# Patient Record
Sex: Female | Born: 1985 | Race: Black or African American | Hispanic: No | Marital: Single | Smoking: Never smoker
Health system: Southern US, Community
[De-identification: ages and names within clinical notes are randomized; demographics above are authoritative.]

## PROBLEM LIST (undated history)

## (undated) ENCOUNTER — Inpatient Hospital Stay (HOSPITAL_COMMUNITY): Payer: Self-pay

## (undated) DIAGNOSIS — G43909 Migraine, unspecified, not intractable, without status migrainosus: Secondary | ICD-10-CM

## (undated) DIAGNOSIS — O039 Complete or unspecified spontaneous abortion without complication: Secondary | ICD-10-CM

## (undated) DIAGNOSIS — A6000 Herpesviral infection of urogenital system, unspecified: Secondary | ICD-10-CM

## (undated) DIAGNOSIS — Z98891 History of uterine scar from previous surgery: Secondary | ICD-10-CM

## (undated) DIAGNOSIS — K219 Gastro-esophageal reflux disease without esophagitis: Secondary | ICD-10-CM

## (undated) DIAGNOSIS — D649 Anemia, unspecified: Secondary | ICD-10-CM

## (undated) DIAGNOSIS — IMO0002 Reserved for concepts with insufficient information to code with codable children: Secondary | ICD-10-CM

## (undated) DIAGNOSIS — I10 Essential (primary) hypertension: Secondary | ICD-10-CM

## (undated) HISTORY — DX: Complete or unspecified spontaneous abortion without complication: O03.9

## (undated) HISTORY — DX: Essential (primary) hypertension: I10

## (undated) HISTORY — DX: Migraine, unspecified, not intractable, without status migrainosus: G43.909

---

## 2011-06-14 DIAGNOSIS — IMO0002 Reserved for concepts with insufficient information to code with codable children: Secondary | ICD-10-CM

## 2011-06-14 HISTORY — PX: COLPOSCOPY: SHX161

## 2011-06-14 HISTORY — DX: Reserved for concepts with insufficient information to code with codable children: IMO0002

## 2011-10-06 ENCOUNTER — Encounter (HOSPITAL_COMMUNITY): Payer: Self-pay | Admitting: *Deleted

## 2011-10-06 ENCOUNTER — Emergency Department (HOSPITAL_COMMUNITY)
Admission: EM | Admit: 2011-10-06 | Discharge: 2011-10-06 | Disposition: A | Discharge status: Home / Self Care | Payer: 59 | Source: Home / Self Care | Attending: Family Medicine | Admitting: Family Medicine

## 2011-10-06 DIAGNOSIS — R350 Frequency of micturition: Secondary | ICD-10-CM

## 2011-10-06 HISTORY — DX: History of uterine scar from previous surgery: Z98.891

## 2011-10-06 LAB — POCT URINALYSIS DIP (DEVICE)
Ketones, ur: NEGATIVE mg/dL
Leukocytes, UA: NEGATIVE
Protein, ur: NEGATIVE mg/dL
pH: 7.5 (ref 5.0–8.0)

## 2011-10-06 MED ORDER — CEPHALEXIN 500 MG PO CAPS: 500.0000 mg | ORAL_CAPSULE | Freq: Two times a day (BID) | ORAL | Status: AC

## 2011-10-06 NOTE — Discharge Instructions (Signed)
Avoid caffeine products. Follow up with your pcp or return if symptoms persist or worsen.

## 2011-10-06 NOTE — ED Notes (Signed)
Pt  Reports  symptpms  Of  Frequent  Urination   Low  abd  Pain     X  sev  Weeks  She  Reports      Symptoms  Similar  To  uti  She  Had  In past

## 2011-10-06 NOTE — ED Provider Notes (Signed)
History     CSN: [id number]  Arrival date & time 10/06/11  1134   First MD Initiated Contact with Patient 10/06/11 1205      Chief Complaint  Patient presents with  . Urinary Tract Infection    (Consider location/radiation/quality/duration/timing/severity/associated sxs/prior treatment) HPI Comments: The patient reports she has symptoms of a uti. Onset 2 wks ago. Has frequency,urgency and voiding small amts. Some pelvic pressure. No flank pain. No n/v/ no fever  The history is provided by the patient.    Past Medical History  Diagnosis Date  . Previous cesarean section     History reviewed. No pertinent past surgical history.  Family History  Problem Relation Age of Onset  . Hypertension Mother     History  Substance Use Topics  . Smoking status: Never Smoker   . Smokeless tobacco: Not on file  . Alcohol Use: Yes    OB History    Grav Para Term Preterm Abortions TAB SAB Ect Mult Living                  Review of Systems  Constitutional: Negative.   HENT: Negative.   Respiratory: Negative.   Cardiovascular: Negative.   Gastrointestinal: Negative.   Genitourinary: Positive for dysuria, urgency and frequency. Negative for hematuria, flank pain, vaginal bleeding, vaginal discharge and vaginal pain.  Musculoskeletal: Negative.     Allergies  Review of patient's allergies indicates no known allergies.  Home Medications   Current Outpatient Rx  Name Route Sig Dispense Refill  . CEPHALEXIN 500 MG PO CAPS Oral Take 1 capsule (500 mg total) by mouth 2 (two) times daily. 14 capsule 0    BP 123/72  Pulse 84  Temp(Src) 97.7 F (36.5 C) (Oral)  Resp 20  SpO2 97%  LMP 09/05/2011  Physical Exam  Nursing note and vitals reviewed. Constitutional: She appears well-developed and well-nourished. No distress.  HENT:  Head: Normocephalic and atraumatic.  Cardiovascular: Normal rate.   Pulmonary/Chest: Effort normal.  Abdominal: Soft. Bowel sounds are normal.  There is no tenderness. There is no rebound and no guarding.  Musculoskeletal: Normal range of motion.  Skin: Skin is warm and dry.    ED Course  Procedures (including critical care time)   Labs Reviewed  POCT PREGNANCY, URINE  POCT URINALYSIS DIP (DEVICE)   No results found.   1. Urinary frequency       MDM          Randa Spike, MD 10/06/11 1229

## 2012-03-13 DIAGNOSIS — A6000 Herpesviral infection of urogenital system, unspecified: Secondary | ICD-10-CM

## 2012-03-13 HISTORY — DX: Herpesviral infection of urogenital system, unspecified: A60.00

## 2012-03-16 ENCOUNTER — Encounter (HOSPITAL_COMMUNITY): Payer: Self-pay | Admitting: Emergency Medicine

## 2012-03-16 ENCOUNTER — Emergency Department (HOSPITAL_COMMUNITY)
Admission: EM | Admit: 2012-03-16 | Discharge: 2012-03-16 | Disposition: A | Discharge status: Home / Self Care | Payer: 59 | Source: Home / Self Care

## 2012-03-16 DIAGNOSIS — R0982 Postnasal drip: Secondary | ICD-10-CM

## 2012-03-16 DIAGNOSIS — J029 Acute pharyngitis, unspecified: Secondary | ICD-10-CM

## 2012-03-16 DIAGNOSIS — J329 Chronic sinusitis, unspecified: Secondary | ICD-10-CM

## 2012-03-16 MED ORDER — MAGIC MOUTHWASH: ORAL | Status: DC

## 2012-03-16 NOTE — ED Provider Notes (Signed)
History     CSN: [id number]  Arrival date & time 03/16/12  1559   None     Chief Complaint  Patient presents with  . Sore Throat    (Consider location/radiation/quality/duration/timing/severity/associated sxs/prior treatment) HPI Comments: 26 year old female with sore throat for one week. She been clearing her throat a lot and complaining of PND. She also has had some sinus issues for which she has taken Sudafed. Associated symptoms include cough that is worse in the morning upon awakening. At this time she also has to blow her nose which sometimes contains blood-tinged mucus. Denies earache, fever,chills or GI symptoms.  Patient is a 26 y.o. female presenting with pharyngitis. The history is provided by the patient.  Sore Throat The current episode started more than 2 days ago. The problem occurs constantly. The problem has not changed since onset.Pertinent negatives include no chest pain, no abdominal pain, no headaches and no shortness of breath.    Past Medical History  Diagnosis Date  . Previous cesarean section     Past Surgical History  Procedure Date  . Cesarean section     Family History  Problem Relation Age of Onset  . Hypertension Mother     History  Substance Use Topics  . Smoking status: Never Smoker   . Smokeless tobacco: Not on file  . Alcohol Use: Yes    OB History    Grav Para Term Preterm Abortions TAB SAB Ect Mult Living                  Review of Systems  Constitutional: Negative for fever, chills, activity change, appetite change and fatigue.  HENT: Positive for congestion, sore throat, sneezing and postnasal drip. Negative for facial swelling, rhinorrhea, trouble swallowing, neck pain, neck stiffness and voice change.   Eyes: Negative.   Respiratory: Negative.  Negative for shortness of breath.   Cardiovascular: Negative.  Negative for chest pain.  Gastrointestinal: Negative for nausea, vomiting and abdominal pain.  Genitourinary:  Negative.   Skin: Negative for pallor and rash.  Neurological: Negative.  Negative for headaches.    Allergies  Review of patient's allergies indicates no known allergies.  Home Medications   Current Outpatient Rx  Name Route Sig Dispense Refill  . DEXTROMETHORPHAN POLISTIREX ER 30 MG/5ML PO LQCR Oral Take 60 mg by mouth as needed.    Marland Kitchen OVER THE COUNTER MEDICATION  Cough drops, pseudofed    . MAGIC MOUTHWASH  Rinse and spit 10 ml q 3 hours 150 mL 0    Use the Diphenhydramine,alumand mag -OH-simeth,Nys ...    BP 119/88  Pulse 72  Temp 98.3 F (36.8 C) (Oral)  Resp 18  SpO2 100%  Physical Exam  Constitutional: She is oriented to person, place, and time. She appears well-developed and well-nourished. No distress.  HENT:  Right Ear: External ear normal.  Left Ear: External ear normal.       OP with patchy erythema. Soft palate is red but not edematous. Soft palate and uvula with spotty red spots some of which appear to be petechiae. No exudates on the tonsillar pillars or the OP however the roof of the mouth has 2 elongated quite patches could possibly be an exudate. The tongue has a thick brownish titrating with some clearing in smoothing of the surface of the posterior aspect  Neck: Normal range of motion. Neck supple.  Cardiovascular: Normal rate and regular rhythm.   Pulmonary/Chest: Effort normal and breath sounds normal. No respiratory distress.  She has no wheezes.  Abdominal: Soft. There is no tenderness.  Musculoskeletal: Normal range of motion. She exhibits no edema.  Lymphadenopathy:    She has no cervical adenopathy.  Neurological: She is alert and oriented to person, place, and time.  Skin: Skin is warm and dry. No rash noted.  Psychiatric: She has a normal mood and affect.    ED Course  Procedures (including critical care time)   Labs Reviewed  POCT RAPID STREP A ([facility] ONLY)   No results found.   1. Pharyngitis   2. Post-nasal discharge        MDM   Results for orders placed during the hospital encounter of 03/16/12  POCT RAPID STREP A ([facility] ONLY)      Component Value Range   Streptococcus, Group A Screen (Direct) NEGATIVE  NEGATIVE   They use Claritin or Allegra daily when necessary PND. Tylenol or Motrin for pain in the throat. Drink plenty of fluids stay well hydrated. Magic mouthwash rinses with 2 teaspoons every 2-3 hours and spit.         Hayden Rasmussen, NP 03/16/12 2047

## 2012-03-16 NOTE — ED Notes (Signed)
Cough, congested , blood tinged phlegm, sore throat, reports "something" on throat.  Onset of symptom about 5-6 days ago

## 2012-03-16 NOTE — ED Provider Notes (Signed)
Medical screening examination/treatment/procedure(s) were performed by non-physician practitioner and as supervising physician I was immediately available for consultation/collaboration.  Leslee Home, M.D.   Reuben Likes, MD 03/16/12 2056

## 2012-03-26 ENCOUNTER — Telehealth: Payer: Self-pay | Admitting: Obstetrics and Gynecology

## 2012-03-29 ENCOUNTER — Telehealth: Payer: Self-pay | Admitting: Obstetrics and Gynecology

## 2012-03-30 ENCOUNTER — Encounter: Payer: Self-pay | Admitting: Obstetrics and Gynecology

## 2012-03-30 ENCOUNTER — Ambulatory Visit (INDEPENDENT_AMBULATORY_CARE_PROVIDER_SITE_OTHER): Payer: 59 | Admitting: Obstetrics and Gynecology

## 2012-03-30 VITALS — BP 120/82 | Temp 99.4°F | Wt 170.0 lb

## 2012-03-30 DIAGNOSIS — D280 Benign neoplasm of vulva: Secondary | ICD-10-CM

## 2012-03-30 DIAGNOSIS — N898 Other specified noninflammatory disorders of vagina: Secondary | ICD-10-CM

## 2012-03-30 DIAGNOSIS — N390 Urinary tract infection, site not specified: Secondary | ICD-10-CM

## 2012-03-30 DIAGNOSIS — R350 Frequency of micturition: Secondary | ICD-10-CM

## 2012-03-30 LAB — POCT URINALYSIS DIPSTICK
Bilirubin, UA: NEGATIVE
Blood, UA: NEGATIVE
Ketones, UA: NEGATIVE
Nitrite, UA: NEGATIVE
Spec Grav, UA: 1.01
pH, UA: 6

## 2012-03-30 LAB — POCT WET PREP (WET MOUNT)
Clue Cells Wet Prep Whiff POC: NEGATIVE
pH: 4.5

## 2012-03-30 MED ORDER — VALACYCLOVIR HCL 1 G PO TABS: 2000.0000 mg | ORAL_TABLET | Freq: Two times a day (BID) | ORAL | Status: AC

## 2012-03-30 NOTE — Progress Notes (Signed)
Pt presents with c/o of two sores on the left labia.  They are painful.  She has never had this before.  No h/o HSV in the past.  She also c/o pelvic pressure with urinating and a white milky discharge BP 120/82  Temp 99.4 F (37.4 C)  Wt 170 lb (77.111 kg)  LMP 03/16/2012 Physical Examination: General appearance - alert, well appearing, and in no distress Chest - clear to auscultation, no wheezes, rales or rhonchi, symmetric air entry Heart - normal rate and regular rhythm Abdomen - soft, nontender, nondistended, no masses or organomegaly Pelvic - VULVA: on the pts left two flat tendr vessicles.  , VAGINA: normal appearing vagina with normal color and discharge, no lesions, CERVIX: normal appearing cervix without discharge or lesions, UTERUS: uterus is normal size, shape, consistency and nontender, ADNEXA: normal adnexa in size, nontender and no masses, WET MOUNT done - results: negative for pathogens, normal epithelial cells Results for orders placed in visit on 03/30/12  POCT WET PREP (WET MOUNT)      Component Value Range   Source Wet Prep POC       WBC, Wet Prep HPF POC       Bacteria Wet Prep HPF POC       BACTERIA WET PREP MORPHOLOGY POC       Clue Cells Wet Prep HPF POC None     CLUE CELLS WET PREP WHIFF POC Negative Whiff     Yeast Wet Prep HPF POC None     KOH Wet Prep POC       Trichomonas Wet Prep HPF POC NONE     pH 4.5    POCT URINALYSIS DIPSTICK      Component Value Range   Color, UA       Clarity, UA       Glucose, UA NEG     Bilirubin, UA NEG     Ketones, UA NEG     Spec Grav, UA 1.010     Blood, UA NEG     pH, UA 6.0     Protein, UA NEG     Urobilinogen, UA negative     Nitrite, UA NEG     Leukocytes, UA small (1+)    vulvar lesions c/w HSV.  Culture sent.  Valtrex rx given to pt Discussed the pathophysiology of HSV and ACOG brochure given to pt R 2 weeks for AEX.  Pt desires std testing at that time

## 2012-04-01 LAB — URINE CULTURE: Organism ID, Bacteria: NO GROWTH

## 2012-04-04 ENCOUNTER — Telehealth: Payer: Self-pay

## 2012-04-04 NOTE — Telephone Encounter (Signed)
Lm on vm tcb rgd labs 

## 2012-04-04 NOTE — Telephone Encounter (Signed)
Message copied by Rolla Plate on Wed Apr 04, 2012  8:55 AM ------      Message from: Jaymes Graff      Created: Tue Apr 03, 2012 10:00 PM       Please give the pt her results.        ----- Message -----         From: Lerry Liner, CMA         Sent: 03/30/2012   2:23 PM           To: Michael Litter, MD

## 2012-04-04 NOTE — Telephone Encounter (Signed)
Spoke with pt rgd labs informed labs wnl pt voice understanding 

## 2012-04-05 ENCOUNTER — Telehealth: Payer: Self-pay | Admitting: Obstetrics and Gynecology

## 2012-04-05 NOTE — Telephone Encounter (Signed)
TC TO PT REGARDING MESSAGE. PT STATES THAT SHE THINK SHE WILL BE ON CYCLE AT THE TIME SHE SCHEDULED APPT AND I TOLD PT AS LONG AS IT IS NOT REAL HVY SHE WOULD BE OKAY AND PT STATES THAT SHE WILL KEEP APPT.

## 2012-04-11 ENCOUNTER — Ambulatory Visit: Payer: 59 | Admitting: Obstetrics and Gynecology

## 2012-10-31 LAB — OB RESULTS CONSOLE ABO/RH: RH Type: POSITIVE

## 2012-10-31 LAB — OB RESULTS CONSOLE RPR: RPR: NONREACTIVE

## 2012-10-31 LAB — OB RESULTS CONSOLE RUBELLA ANTIBODY, IGM: Rubella: IMMUNE

## 2013-05-01 ENCOUNTER — Other Ambulatory Visit: Payer: Self-pay | Admitting: Obstetrics and Gynecology

## 2013-05-06 LAB — OB RESULTS CONSOLE GBS: GBS: NEGATIVE

## 2013-05-29 ENCOUNTER — Encounter (HOSPITAL_COMMUNITY)
Admission: RE | Admit: 2013-05-29 | Discharge: 2013-05-29 | Disposition: A | Discharge status: Home / Self Care | Payer: 59 | Source: Ambulatory Visit | Attending: Obstetrics and Gynecology | Admitting: Obstetrics and Gynecology

## 2013-05-29 ENCOUNTER — Encounter (HOSPITAL_COMMUNITY): Payer: Self-pay | Admitting: Pharmacist

## 2013-05-29 ENCOUNTER — Encounter (HOSPITAL_COMMUNITY): Payer: Self-pay

## 2013-05-29 DIAGNOSIS — Z01812 Encounter for preprocedural laboratory examination: Secondary | ICD-10-CM | POA: Insufficient documentation

## 2013-05-29 HISTORY — DX: Gastro-esophageal reflux disease without esophagitis: K21.9

## 2013-05-29 HISTORY — DX: Anemia, unspecified: D64.9

## 2013-05-29 LAB — ABO/RH: ABO/RH(D): O POS

## 2013-05-29 LAB — CBC
HCT: 29.8 % — ABNORMAL LOW (ref 36.0–46.0)
Hemoglobin: 9.3 g/dL — ABNORMAL LOW (ref 12.0–15.0)
MCH: 20.5 pg — ABNORMAL LOW (ref 26.0–34.0)
MCHC: 31.2 g/dL (ref 30.0–36.0)
MCV: 65.6 fL — ABNORMAL LOW (ref 78.0–100.0)

## 2013-05-29 LAB — TYPE AND SCREEN

## 2013-05-29 NOTE — Patient Instructions (Addendum)
Your procedure is scheduled on: 05/31/2013  Enter through the Main Entrance of [facility] at: 0745am  Pick up the phone at the desk and dial [phone].  Call this number if you have problems the morning of surgery: [phone].  Remember: Do NOT eat food: AFTER MIDNIGHT THURSDAY Do NOT drink clear liquids after: AFTER MIDNIGHT THURSDAY Take these medicines the morning of surgery with a SIP OF WATER: Zantac  Do NOT wear jewelry (body piercing), make-up, or nail polish. Do NOT wear lotions, powders, or perfumes.  You may wear deoderant. Do NOT shave for 48 hours prior to surgery. Do NOT bring valuables to the hospital. Contacts, dentures, or bridgework may not be worn into surgery. Leave suitcase in car.  After surgery it may be brought to your room.  For patients admitted to the hospital, checkout time is 11:00 AM the day of discharge.

## 2013-05-30 MED ORDER — CEFAZOLIN SODIUM-DEXTROSE 2-3 GM-% IV SOLR
2.0000 g | INTRAVENOUS | Status: AC
  Administered 2013-05-31: 2 g via INTRAVENOUS

## 2013-05-30 MED ORDER — SCOPOLAMINE 1 MG/3DAYS TD PT72: 1.0000 | MEDICATED_PATCH | Freq: Once | TRANSDERMAL | Status: DC

## 2013-05-30 NOTE — H&P (Signed)
Rhonda Ramsey is a 27 y.o. female, G3P1011 at 65 2/7 weeks, presenting for scheduled repeat cesarean on 05/31/13. Denies leaking or bleeding, reports +FM.  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   EDC of 06/05/13 was established by LMP.   Anatomy scan:  17 6/7 weeks, with normal findings and an anterior placenta--limited anatomy Additional [place] evaluations:   20 6/7 weeks, to complete anatomy--all WNL 24 6/7 weeks, due to ? Leaking--all WNL 33 1/7 weeks--EFW 2360, 71.7%ile, normal AFI.   Significant prenatal events:  Received TDaP 10/6, received flu vaccine at work at [employer]. Decided repeat C/S during pregnancy.  Started on FE for iron prophylaxis. Last evaluation:  05/23/13, cervix closed.    OB History   Grav Para Term Preterm Abortions TAB SAB Ect Mult Living   3 1 1  1 1    1     2008--Primary LTCS, in [place], [place], due to [place] and NRFHR.  12 hour labor, 7+4, female. 2013--TAB at 10 weeks  Past Medical History  Diagnosis Date  . Previous cesarean section   . GERD (gastroesophageal reflux disease)   . Anemia    Past Surgical History  Procedure Laterality Date  . Cesarean section     Family History: family history includes Hypertension in her mother. Social History:  reports that she has never smoked. She does not have any smokeless tobacco history on file. She reports that she does not drink alcohol or use illicit drugs.  FOB, [employer], has been involved and supportive. 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   Prenatal Transfer Tool  Maternal Diabetes: No Genetic Screening: Normal Quad screen Maternal Ultrasounds/Referrals: Normal Fetal Ultrasounds or other Referrals:  None Maternal Substance Abuse:  No Significant Maternal Medications:  None Significant Maternal Lab  Results: Lab values include: Group B Strep negative    ROS:  +FM, occasional contractions  No Known Allergies     Last menstrual period 08/29/2012.  Chest clear Heart RRR without murmur Abd gravid, NT, FH 39 Pelvic: Deferred Ext: WNL  FHR: 150 per doppler at office UCs:  None per patient  Prenatal labs: ABO, Rh: --/--/O POS, O POS (12/17 1205) Antibody: NEG (12/17 1205) Rubella:   Immune RPR: NON REACTIVE (12/17 1205)  HBsAg: Negative (05/21 0000)  HIV: Non-reactive (05/21 0000)  GBS:  Negative Sickle cell/Hgb electrophoresis:  Neg Pap:  2012 GC:  Negative at [facility] Chlamydia:  Negative at [facility] Genetic screenings:  Quad screen WNL Glucola:  WNL Other:  Hgb 11.4 at [facility], 9.3 at 28 weeks       Assessment/Plan: IUP at 39 2/7 weeks Previous cesarean, desires repeat Hx HSV, on prophylaxis Anemia  Plan: Admit to [facility] per consult with Dr. Estanislado Pandy Routine [facility] orders. R&B of C/S have been reviewed with the patient, including bleeding, infection, anesthesia, and damage to other organs.  Patient seems to understand these risks and wisheds to proceed.  Jamia Hoban, VICKICNM, [place] 05/30/2013, 1:48 AM

## 2013-05-31 ENCOUNTER — Encounter (HOSPITAL_COMMUNITY)
Admission: AD | Disposition: A | Discharge status: Home / Self Care | Payer: Self-pay | Source: Ambulatory Visit | Attending: Obstetrics and Gynecology

## 2013-05-31 ENCOUNTER — Encounter (HOSPITAL_COMMUNITY): Payer: 59 | Admitting: Anesthesiology

## 2013-05-31 ENCOUNTER — Inpatient Hospital Stay (HOSPITAL_COMMUNITY): Admission: AD | Admit: 2013-05-31 | Payer: 59 | Source: Ambulatory Visit | Admitting: Obstetrics and Gynecology

## 2013-05-31 ENCOUNTER — Encounter (HOSPITAL_COMMUNITY): Payer: Self-pay

## 2013-05-31 ENCOUNTER — Inpatient Hospital Stay (HOSPITAL_COMMUNITY)
Admission: AD | Admit: 2013-05-31 | Discharge: 2013-06-03 | DRG: 766 | Disposition: A | Discharge status: Home / Self Care | Payer: 59 | Source: Ambulatory Visit | Attending: Obstetrics and Gynecology | Admitting: Obstetrics and Gynecology

## 2013-05-31 ENCOUNTER — Inpatient Hospital Stay (HOSPITAL_COMMUNITY): Payer: 59 | Admitting: Anesthesiology

## 2013-05-31 DIAGNOSIS — O34219 Maternal care for unspecified type scar from previous cesarean delivery: Principal | ICD-10-CM | POA: Diagnosis present

## 2013-05-31 DIAGNOSIS — K219 Gastro-esophageal reflux disease without esophagitis: Secondary | ICD-10-CM | POA: Diagnosis present

## 2013-05-31 DIAGNOSIS — D649 Anemia, unspecified: Secondary | ICD-10-CM | POA: Diagnosis present

## 2013-05-31 DIAGNOSIS — O9902 Anemia complicating childbirth: Secondary | ICD-10-CM | POA: Diagnosis present

## 2013-05-31 DIAGNOSIS — E669 Obesity, unspecified: Secondary | ICD-10-CM | POA: Diagnosis present

## 2013-05-31 HISTORY — DX: Herpesviral infection of urogenital system, unspecified: A60.00

## 2013-05-31 HISTORY — DX: Reserved for concepts with insufficient information to code with codable children: IMO0002

## 2013-05-31 LAB — CBC
HCT: 29.9 % — ABNORMAL LOW (ref 36.0–46.0)
MCH: 20.1 pg — ABNORMAL LOW (ref 26.0–34.0)
MCHC: 30.8 g/dL (ref 30.0–36.0)
Platelets: 234 10*3/uL (ref 150–400)
RDW: 15.7 % — ABNORMAL HIGH (ref 11.5–15.5)
WBC: 9.2 10*3/uL (ref 4.0–10.5)

## 2013-05-31 LAB — POCT FERN TEST: POCT Fern Test: POSITIVE

## 2013-05-31 SURGERY — Surgical Case
Anesthesia: Spinal

## 2013-05-31 MED ORDER — CEFAZOLIN SODIUM-DEXTROSE 2-3 GM-% IV SOLR
INTRAVENOUS | Status: AC
  Filled 2013-05-31: qty 50

## 2013-05-31 MED ORDER — METOCLOPRAMIDE HCL 5 MG/ML IJ SOLN: 10.0000 mg | Freq: Three times a day (TID) | INTRAMUSCULAR | Status: DC | PRN

## 2013-05-31 MED ORDER — NALOXONE HCL 1 MG/ML IJ SOLN
1.0000 ug/kg/h | INTRAVENOUS | Status: DC | PRN
  Filled 2013-05-31: qty 2

## 2013-05-31 MED ORDER — NALBUPHINE HCL 10 MG/ML IJ SOLN
5.0000 mg | INTRAMUSCULAR | Status: DC | PRN
  Filled 2013-05-31: qty 1

## 2013-05-31 MED ORDER — DIPHENHYDRAMINE HCL 50 MG/ML IJ SOLN: 25.0000 mg | INTRAMUSCULAR | Status: DC | PRN

## 2013-05-31 MED ORDER — WITCH HAZEL-GLYCERIN EX PADS: 1.0000 "application " | MEDICATED_PAD | CUTANEOUS | Status: DC | PRN

## 2013-05-31 MED ORDER — MORPHINE SULFATE 0.5 MG/ML IJ SOLN
INTRAMUSCULAR | Status: AC
  Filled 2013-05-31: qty 10

## 2013-05-31 MED ORDER — LANOLIN HYDROUS EX OINT: 1.0000 "application " | TOPICAL_OINTMENT | CUTANEOUS | Status: DC | PRN

## 2013-05-31 MED ORDER — LACTATED RINGERS IV SOLN
INTRAVENOUS | Status: DC | PRN
  Administered 2013-05-31: 09:00:00 via INTRAVENOUS

## 2013-05-31 MED ORDER — ONDANSETRON HCL 4 MG/2ML IJ SOLN: 4.0000 mg | INTRAMUSCULAR | Status: DC | PRN

## 2013-05-31 MED ORDER — OXYTOCIN 10 UNIT/ML IJ SOLN
40.0000 [IU] | INTRAVENOUS | Status: DC | PRN
  Administered 2013-05-31: 40 [IU] via INTRAVENOUS

## 2013-05-31 MED ORDER — DIPHENHYDRAMINE HCL 50 MG/ML IJ SOLN: 12.5000 mg | INTRAMUSCULAR | Status: DC | PRN

## 2013-05-31 MED ORDER — METHYLERGONOVINE MALEATE 0.2 MG/ML IJ SOLN: 0.2000 mg | INTRAMUSCULAR | Status: DC | PRN

## 2013-05-31 MED ORDER — LACTATED RINGERS IV SOLN: INTRAVENOUS | Status: DC

## 2013-05-31 MED ORDER — SIMETHICONE 80 MG PO CHEW
80.0000 mg | CHEWABLE_TABLET | ORAL | Status: DC
  Administered 2013-05-31 – 2013-06-03 (×3): 80 mg via ORAL
  Filled 2013-05-31 (×3): qty 1

## 2013-05-31 MED ORDER — SIMETHICONE 80 MG PO CHEW
80.0000 mg | CHEWABLE_TABLET | ORAL | Status: DC | PRN
  Administered 2013-05-31: 80 mg via ORAL

## 2013-05-31 MED ORDER — ACETAMINOPHEN 500 MG PO TABS
1000.0000 mg | ORAL_TABLET | Freq: Once | ORAL | Status: AC
  Administered 2013-05-31: 1000 mg via ORAL
  Filled 2013-05-31: qty 2

## 2013-05-31 MED ORDER — FERROUS SULFATE 325 (65 FE) MG PO TABS
325.0000 mg | ORAL_TABLET | Freq: Two times a day (BID) | ORAL | Status: DC
  Filled 2013-05-31: qty 1

## 2013-05-31 MED ORDER — SIMETHICONE 80 MG PO CHEW
80.0000 mg | CHEWABLE_TABLET | Freq: Three times a day (TID) | ORAL | Status: DC
  Administered 2013-06-01 – 2013-06-03 (×6): 80 mg via ORAL
  Filled 2013-05-31 (×7): qty 1

## 2013-05-31 MED ORDER — LACTATED RINGERS IV SOLN
INTRAVENOUS | Status: DC
  Administered 2013-05-31 (×4): via INTRAVENOUS

## 2013-05-31 MED ORDER — FENTANYL CITRATE 0.05 MG/ML IJ SOLN
INTRAMUSCULAR | Status: DC | PRN
  Administered 2013-05-31: 25 ug via INTRATHECAL

## 2013-05-31 MED ORDER — PHENYLEPHRINE HCL 10 MG/ML IJ SOLN
INTRAMUSCULAR | Status: AC
  Filled 2013-05-31: qty 1

## 2013-05-31 MED ORDER — TETANUS-DIPHTH-ACELL PERTUSSIS 5-2.5-18.5 LF-MCG/0.5 IM SUSP: 0.5000 mL | Freq: Once | INTRAMUSCULAR | Status: DC

## 2013-05-31 MED ORDER — PHENYLEPHRINE 8 MG IN D5W 100 ML (0.08MG/ML) PREMIX OPTIME
INJECTION | INTRAVENOUS | Status: DC | PRN
  Administered 2013-05-31: 45 ug/min via INTRAVENOUS

## 2013-05-31 MED ORDER — ZOLPIDEM TARTRATE 5 MG PO TABS: 5.0000 mg | ORAL_TABLET | Freq: Every evening | ORAL | Status: DC | PRN

## 2013-05-31 MED ORDER — METHYLERGONOVINE MALEATE 0.2 MG PO TABS: 0.2000 mg | ORAL_TABLET | ORAL | Status: DC | PRN

## 2013-05-31 MED ORDER — ONDANSETRON HCL 4 MG/2ML IJ SOLN
4.0000 mg | Freq: Three times a day (TID) | INTRAMUSCULAR | Status: DC | PRN
  Administered 2013-05-31: 4 mg via INTRAVENOUS

## 2013-05-31 MED ORDER — OXYTOCIN 40 UNITS IN LACTATED RINGERS INFUSION - SIMPLE MED: 62.5000 mL/h | INTRAVENOUS | Status: AC

## 2013-05-31 MED ORDER — DIPHENHYDRAMINE HCL 25 MG PO CAPS: 25.0000 mg | ORAL_CAPSULE | Freq: Four times a day (QID) | ORAL | Status: DC | PRN

## 2013-05-31 MED ORDER — OXYTOCIN 10 UNIT/ML IJ SOLN
INTRAMUSCULAR | Status: AC
  Filled 2013-05-31: qty 4

## 2013-05-31 MED ORDER — DEXAMETHASONE SODIUM PHOSPHATE 4 MG/ML IJ SOLN
INTRAMUSCULAR | Status: AC
  Administered 2013-05-31: 4 mg
  Filled 2013-05-31: qty 2

## 2013-05-31 MED ORDER — OXYCODONE-ACETAMINOPHEN 5-325 MG PO TABS
1.0000 | ORAL_TABLET | ORAL | Status: DC | PRN
  Administered 2013-06-01 – 2013-06-03 (×7): 1 via ORAL
  Filled 2013-05-31 (×7): qty 1

## 2013-05-31 MED ORDER — DIBUCAINE 1 % RE OINT: 1.0000 "application " | TOPICAL_OINTMENT | RECTAL | Status: DC | PRN

## 2013-05-31 MED ORDER — MENTHOL 3 MG MT LOZG
1.0000 | LOZENGE | OROMUCOSAL | Status: DC | PRN
  Administered 2013-05-31: 3 mg via ORAL
  Filled 2013-05-31: qty 9

## 2013-05-31 MED ORDER — PROMETHAZINE HCL 25 MG/ML IJ SOLN: 6.2500 mg | Freq: Two times a day (BID) | INTRAMUSCULAR | Status: DC | PRN

## 2013-05-31 MED ORDER — ONDANSETRON HCL 4 MG/2ML IJ SOLN
INTRAMUSCULAR | Status: AC
  Administered 2013-05-31: 4 mg via INTRAVENOUS
  Filled 2013-05-31: qty 2

## 2013-05-31 MED ORDER — PROMETHAZINE HCL 25 MG/ML IJ SOLN
INTRAMUSCULAR | Status: AC
  Administered 2013-05-31: 25 mg
  Filled 2013-05-31: qty 1

## 2013-05-31 MED ORDER — KETOROLAC TROMETHAMINE 30 MG/ML IJ SOLN
30.0000 mg | Freq: Once | INTRAMUSCULAR | Status: AC
  Administered 2013-05-31: 30 mg via INTRAVENOUS
  Filled 2013-05-31: qty 1

## 2013-05-31 MED ORDER — BUPIVACAINE HCL (PF) 0.25 % IJ SOLN
INTRAMUSCULAR | Status: DC | PRN
  Administered 2013-05-31: 20 mL

## 2013-05-31 MED ORDER — MEPERIDINE HCL 25 MG/ML IJ SOLN: 6.2500 mg | INTRAMUSCULAR | Status: DC | PRN

## 2013-05-31 MED ORDER — DEXAMETHASONE SODIUM PHOSPHATE 10 MG/ML IJ SOLN
8.0000 mg | Freq: Once | INTRAMUSCULAR | Status: DC
  Filled 2013-05-31: qty 0.8

## 2013-05-31 MED ORDER — FENTANYL CITRATE 0.05 MG/ML IJ SOLN
INTRAMUSCULAR | Status: DC | PRN
  Administered 2013-05-31: 75 ug via INTRAVENOUS

## 2013-05-31 MED ORDER — SODIUM CHLORIDE 0.9 % IJ SOLN: 3.0000 mL | INTRAMUSCULAR | Status: DC | PRN

## 2013-05-31 MED ORDER — NALOXONE HCL 0.4 MG/ML IJ SOLN: 0.4000 mg | INTRAMUSCULAR | Status: DC | PRN

## 2013-05-31 MED ORDER — ONDANSETRON HCL 4 MG/2ML IJ SOLN
INTRAMUSCULAR | Status: DC | PRN
  Administered 2013-05-31: 4 mg via INTRAVENOUS

## 2013-05-31 MED ORDER — ONDANSETRON HCL 4 MG PO TABS: 4.0000 mg | ORAL_TABLET | ORAL | Status: DC | PRN

## 2013-05-31 MED ORDER — PRENATAL MULTIVITAMIN CH
1.0000 | ORAL_TABLET | Freq: Every day | ORAL | Status: DC
  Administered 2013-06-01 – 2013-06-03 (×3): 1 via ORAL
  Filled 2013-05-31 (×3): qty 1

## 2013-05-31 MED ORDER — MEASLES, MUMPS & RUBELLA VAC ~~LOC~~ INJ
0.5000 mL | INJECTION | Freq: Once | SUBCUTANEOUS | Status: DC
  Filled 2013-05-31: qty 0.5

## 2013-05-31 MED ORDER — SCOPOLAMINE 1 MG/3DAYS TD PT72
1.0000 | MEDICATED_PATCH | Freq: Once | TRANSDERMAL | Status: AC
  Administered 2013-05-31: 1.5 mg via TRANSDERMAL

## 2013-05-31 MED ORDER — FENTANYL CITRATE 0.05 MG/ML IJ SOLN: 25.0000 ug | INTRAMUSCULAR | Status: DC | PRN

## 2013-05-31 MED ORDER — SENNOSIDES-DOCUSATE SODIUM 8.6-50 MG PO TABS
2.0000 | ORAL_TABLET | ORAL | Status: DC
  Administered 2013-05-31 – 2013-06-03 (×3): 2 via ORAL
  Filled 2013-05-31: qty 1
  Filled 2013-05-31 (×3): qty 2

## 2013-05-31 MED ORDER — SCOPOLAMINE 1 MG/3DAYS TD PT72
MEDICATED_PATCH | TRANSDERMAL | Status: AC
  Administered 2013-05-31: 1.5 mg via TRANSDERMAL
  Filled 2013-05-31: qty 1

## 2013-05-31 MED ORDER — ONDANSETRON HCL 4 MG/2ML IJ SOLN
INTRAMUSCULAR | Status: AC
  Filled 2013-05-31: qty 2

## 2013-05-31 MED ORDER — MORPHINE SULFATE (PF) 0.5 MG/ML IJ SOLN
INTRAMUSCULAR | Status: DC | PRN
  Administered 2013-05-31: .15 mg via INTRATHECAL

## 2013-05-31 MED ORDER — FENTANYL CITRATE 0.05 MG/ML IJ SOLN
INTRAMUSCULAR | Status: AC
  Filled 2013-05-31: qty 2

## 2013-05-31 MED ORDER — DIPHENHYDRAMINE HCL 25 MG PO CAPS: 25.0000 mg | ORAL_CAPSULE | ORAL | Status: DC | PRN

## 2013-05-31 MED ORDER — IBUPROFEN 600 MG PO TABS
600.0000 mg | ORAL_TABLET | Freq: Four times a day (QID) | ORAL | Status: DC
  Administered 2013-05-31 – 2013-06-03 (×11): 600 mg via ORAL
  Filled 2013-05-31 (×11): qty 1

## 2013-05-31 MED ORDER — FERROUS SULFATE DRIED ER 160 (50 FE) MG PO TBCR: 160.0000 mg | EXTENDED_RELEASE_TABLET | Freq: Every day | ORAL | Status: DC

## 2013-05-31 SURGICAL SUPPLY — 36 items
BARRIER ADHS 3X4 INTERCEED (GAUZE/BANDAGES/DRESSINGS) ×2 IMPLANT
BENZOIN TINCTURE PRP APPL 2/3 (GAUZE/BANDAGES/DRESSINGS) ×2 IMPLANT
BOOTIES KNEE HIGH SLOAN (MISCELLANEOUS) ×4 IMPLANT
CLAMP CORD UMBIL (MISCELLANEOUS) IMPLANT
CLOTH BEACON ORANGE TIMEOUT ST (SAFETY) ×2 IMPLANT
DRAIN JACKSON PRT FLT 10 (DRAIN) IMPLANT
DRAPE LG THREE QUARTER DISP (DRAPES) IMPLANT
DRSG OPSITE POSTOP 4X10 (GAUZE/BANDAGES/DRESSINGS) ×2 IMPLANT
DURAPREP 26ML APPLICATOR (WOUND CARE) ×2 IMPLANT
ELECT REM PT RETURN 9FT ADLT (ELECTROSURGICAL) ×2
ELECTRODE REM PT RTRN 9FT ADLT (ELECTROSURGICAL) ×1 IMPLANT
EVACUATOR SILICONE 100CC (DRAIN) IMPLANT
EXTRACTOR VACUUM M CUP 4 TUBE (SUCTIONS) IMPLANT
GLOVE BIOGEL PI IND STRL 7.0 (GLOVE) ×1 IMPLANT
GLOVE BIOGEL PI INDICATOR 7.0 (GLOVE) ×1
GLOVE ECLIPSE 6.5 STRL STRAW (GLOVE) ×2 IMPLANT
GOWN PREVENTION PLUS XLARGE (GOWN DISPOSABLE) ×2 IMPLANT
GOWN STRL REIN XL XLG (GOWN DISPOSABLE) ×2 IMPLANT
KIT ABG SYR 3ML LUER SLIP (SYRINGE) IMPLANT
NEEDLE HYPO 22GX1.5 SAFETY (NEEDLE) ×2 IMPLANT
NEEDLE HYPO 25X5/8 SAFETYGLIDE (NEEDLE) IMPLANT
NS IRRIG 1000ML POUR BTL (IV SOLUTION) ×4 IMPLANT
PACK C SECTION WH (CUSTOM PROCEDURE TRAY) ×2 IMPLANT
PAD OB MATERNITY 4.3X12.25 (PERSONAL CARE ITEMS) ×2 IMPLANT
RTRCTR C-SECT PINK 25CM LRG (MISCELLANEOUS) ×2 IMPLANT
STRIP CLOSURE SKIN 1/2X4 (GAUZE/BANDAGES/DRESSINGS) ×2 IMPLANT
SUT CHROMIC GUT AB #0 18 (SUTURE) IMPLANT
SUT MNCRL AB 3-0 PS2 27 (SUTURE) ×2 IMPLANT
SUT SILK 2 0 FSL 18 (SUTURE) IMPLANT
SUT VIC AB 0 CTX 36 (SUTURE) ×2
SUT VIC AB 0 CTX36XBRD ANBCTRL (SUTURE) ×2 IMPLANT
SUT VIC AB 1 CT1 36 (SUTURE) ×4 IMPLANT
SYR 20CC LL (SYRINGE) ×2 IMPLANT
TOWEL OR 17X24 6PK STRL BLUE (TOWEL DISPOSABLE) ×2 IMPLANT
TRAY FOLEY CATH 14FR (SET/KITS/TRAYS/PACK) ×2 IMPLANT
WATER STERILE IRR 1000ML POUR (IV SOLUTION) ×2 IMPLANT

## 2013-05-31 NOTE — Anesthesia Procedure Notes (Signed)

## 2013-05-31 NOTE — H&P (Signed)
Update to previously-written pre-op H&P: Patient presented this am with report of SROM at 2:20am, "some brownish fluid", and mild contractions.  Has been NPO since before MN, in prep for surgery at 9:30am today.  Updated PE: Chest clear Heart RRR without murmur Abd gravid, NT Pelvic--leaking small amount clear fluid, cervix 1 cm, 70%, vtx, -2. Ext WNL  FHR Category 1 UCs q 5-7 min, mild.  Recent Results (from the past 2160 hour(s))  OB RESULTS CONSOLE GBS     Status: None   Collection Time    05/06/13 12:00 AM      Result Value Range   GBS Negative    CBC     Status: Abnormal   Collection Time    05/29/13 12:05 PM      Result Value Range   WBC 7.3  4.0 - 10.5 K/uL   RBC 4.54  3.87 - 5.11 MIL/uL   Hemoglobin 9.3 (*) 12.0 - 15.0 g/dL   HCT 16.1 (*) 09.6 - 04.5 %   MCV 65.6 (*) 78.0 - 100.0 fL   MCH 20.5 (*) 26.0 - 34.0 pg   MCHC 31.2  30.0 - 36.0 g/dL   RDW 40.9 (*) 81.1 - 91.4 %   Platelets 225  150 - 400 K/uL  RPR     Status: None   Collection Time    05/29/13 12:05 PM      Result Value Range   RPR NON REACTIVE  NON REACTIVE   Comment: Performed at [facility]  TYPE AND SCREEN     Status: None   Collection Time    05/29/13 12:05 PM      Result Value Range   ABO/RH(D) O POS     Antibody Screen NEG     Sample Expiration 06/01/2013    ABO/RH     Status: None   Collection Time    05/29/13 12:05 PM      Result Value Range   ABO/RH(D) O POS    POCT FERN TEST     Status: None   Collection Time    05/31/13  5:29 AM      Result Value Range   POCT Fern Test Positive = ruptured amniotic membanes       Admitted to [facility] per consult with Dr. Dion Body as on-call MD. Routine pre-op orders Repeat CBC due to Hgb of 9.3 on 05/29/13. T&S still in force per lab. Will schedule with OR. R&B of c/s reviewed with patient, including risks of anesthesia, bleeding, infection, and damage to other organs.  Patient seems to understand these risks and wishes to proceed with  repeat C/S.  Nigel Bridgeman, CNM 05/31/13 6am

## 2013-05-31 NOTE — Anesthesia Postprocedure Evaluation (Signed)
  Anesthesia Post-op Note  Patient: Rhonda Ramsey  Procedure(s) Performed: Procedure(s): REPEAT CESAREAN SECTION (N/A)  Patient Location: PACU  Anesthesia Type:Spinal  Level of Consciousness: awake, alert  and oriented  Airway and Oxygen Therapy: Patient Spontanous Breathing  Post-op Pain: none  Post-op Assessment: Post-op Vital signs reviewed, Patient's Cardiovascular Status Stable, Respiratory Function Stable, Patent Airway, No signs of Nausea or vomiting, Pain level controlled, No headache, No backache, No residual numbness and No residual motor weakness  Post-op Vital Signs: Reviewed and stable  Complications: No apparent anesthesia complications

## 2013-05-31 NOTE — H&P (View-Only) (Signed)
Dr. Jackson informed of high PPH risk, pt has current type & screen, no further orders needed @ this time per MD. 

## 2013-05-31 NOTE — Transfer of Care (Signed)
Immediate Anesthesia Transfer of Care Note  Patient: Rhonda Ramsey  Procedure(s) Performed: Procedure(s): REPEAT CESAREAN SECTION (N/A)  Patient Location: PACU  Anesthesia Type:Spinal  Level of Consciousness: awake  Airway & Oxygen Therapy: Patient Spontanous Breathing  Post-op Assessment: Report given to PACU RN and Post -op Vital signs reviewed and stable  Post vital signs: stable  Complications: No apparent anesthesia complications

## 2013-05-31 NOTE — Anesthesia Preprocedure Evaluation (Signed)
Anesthesia Evaluation  Patient identified by MRN, date of birth, ID band Patient awake    Reviewed: Allergy & Precautions, H&P , Patient's Chart, lab work & pertinent test results  Airway Mallampati: II TM Distance: >3 FB Neck ROM: full    Dental no notable dental hx.    Pulmonary  breath sounds clear to auscultation  Pulmonary exam normal       Cardiovascular Exercise Tolerance: Good Rhythm:regular Rate:Normal     Neuro/Psych    GI/Hepatic hiatal hernia,   Endo/Other    Renal/GU      Musculoskeletal   Abdominal   Peds  Hematology  (+) anemia ,   Anesthesia Other Findings   Reproductive/Obstetrics                           Anesthesia Physical Anesthesia Plan  ASA: III  Anesthesia Plan: Spinal   Post-op Pain Management:    Induction:   Airway Management Planned:   Additional Equipment:   Intra-op Plan:   Post-operative Plan:   Informed Consent: I have reviewed the patients History and Physical, chart, labs and discussed the procedure including the risks, benefits and alternatives for the proposed anesthesia with the patient or authorized representative who has indicated his/her understanding and acceptance.   Dental Advisory Given  Plan Discussed with: CRNA  Anesthesia Plan Comments: (Lab work confirmed with CRNA in room. Platelets okay. Discussed spinal anesthetic, and patient consents to the procedure:  included risk of possible headache,backache, failed block, allergic reaction, and nerve injury. This patient was asked if she had any questions or concerns before the procedure started. )        Anesthesia Quick Evaluation

## 2013-05-31 NOTE — MAU Note (Signed)
Leaking fluid since 230 this morning. Denies VB. Some contractions that are getting more painful. Positive fetal movement.

## 2013-05-31 NOTE — Op Note (Signed)
Preoperative diagnosis: Intrauterine pregnancy at 39 weeks and 2 days with previous cesarean section and early labor  Post operative diagnosis: Same  Anesthesia: Spinal  Anesthesiologist: Dr. Rodman Pickle  Procedure: Repeat low transverse cesarean section  Surgeon: Dr. Dois Davenport Shereen Marton  Assistant: Haroldine Laws CNM  Estimated blood loss: 700 cc  Procedure:  After being informed of the planned procedure and possible complications including bleeding, infection, injury to other organs, informed consent is obtained. The patient is taken to OR #2 and given spinal anesthesia without complication. She is placed in the dorsal decubitus position with the pelvis tilted to the left. She is then prepped and draped in a sterile fashion. A Foley catheter is inserted in her bladder.  After assessing adequate level of anesthesia, we infiltrate the suprapubic area with 20 cc of Marcaine 0.25 and perform a Pfannenstiel incision which is brought down sharply to the fascia. The fascia is entered in a low transverse fashion. Linea alba is dissected. Peritoneum is entered in a midline fashion. An Alexis retractor is easily positioned after lysis of adhesions between lower abdominal wall and uterus.  The myometrium is then entered in a low transverse fashion, 2 cm above the vesico-uterine junction ; first with knife and then extended bluntly. Amniotic fluid is clear. We assist the birth of a female  infant in vertex presentation. Mouth and nose are suctioned. The baby is delivered. The cord is clamped and sectioned. The baby is given to the neonatologist present in the room.  10 cc of blood is drawn from the umbilical vein.The placenta is allowed to deliver spontaneously. It is complete and the cord has 3 vessels. Uterine revision is negative.  We proceed with closure of the myometrium in 2 layers: First with a running locked suture of 0 Vicryl, then with a Lembert suture of 0 Vicryl imbricating the first one. Hemostasis  is completed with cauterization on peritoneal edges and a figure-of-eight stitch of 0-Vicryl on the right angle.  Both paracolic gutters are cleaned. Both tubes and ovaries are assessed and normal. The pelvis is profusely irrigated with warm saline to confirm a satisfactory hemostasis. A Interceed sheet is placed on the uterine incision.  Retractors and sponges are removed. Under fascia hemostasis is completed with cauterization. The fascia is then closed with 2 running sutures of 0 Vicryl meeting midline. The wound is irrigated with warm saline and hemostasis is completed with cauterization. The skin is closed with a subcuticular suture of 3-0 Monocryl and Steri-Strips.  Instrument and sponge count is complete x2. Estimated blood loss is 700 cc.  The procedure is well tolerated by the patient who is taken to recovery room in a well and stable condition.  female baby named [name] was born at 8:34 and received an Apgar of 9  at 1 minute and 10 at 5 minutes.    Specimen: Placenta sent to L & D   Rhonda Ramsey A MD 12/19/20149:30 AM

## 2013-05-31 NOTE — Interval H&P Note (Signed)
History and Physical Interval Note:  05/31/2013 7:45 AM  Rhonda Ramsey  has presented today for surgery, with the diagnosis of Prior Cesarean Section  The various methods of treatment have been discussed with the patient and family. After consideration of risks, benefits and other options for treatment, the patient has consented to  Procedure(s): REPEAT CESAREAN SECTION (N/A) as a surgical intervention .  The patient's history has been reviewed, patient examined, no change in status, stable for surgery.  I have reviewed the patient's chart and labs.  Questions were answered to the patient's satisfaction.     Krishiv Sandler A

## 2013-05-31 NOTE — Progress Notes (Signed)
Dr. Jean Rosenthal informed of high PPH risk, pt has current type & screen, no further orders needed @ this time per MD.

## 2013-06-01 LAB — CBC
HCT: 27.8 % — ABNORMAL LOW (ref 36.0–46.0)
Hemoglobin: 8.6 g/dL — ABNORMAL LOW (ref 12.0–15.0)
MCH: 20.3 pg — ABNORMAL LOW (ref 26.0–34.0)
MCHC: 30.9 g/dL (ref 30.0–36.0)
MCV: 65.7 fL — ABNORMAL LOW (ref 78.0–100.0)
Platelets: 214 10*3/uL (ref 150–400)
RBC: 4.23 MIL/uL (ref 3.87–5.11)
RDW: 16 % — ABNORMAL HIGH (ref 11.5–15.5)
WBC: 18.8 10*3/uL — ABNORMAL HIGH (ref 4.0–10.5)

## 2013-06-01 MED ORDER — POLYSACCHARIDE IRON COMPLEX 150 MG PO CAPS
150.0000 mg | ORAL_CAPSULE | Freq: Every day | ORAL | Status: DC
  Administered 2013-06-01 – 2013-06-03 (×3): 150 mg via ORAL
  Filled 2013-06-01 (×3): qty 1

## 2013-06-01 NOTE — Lactation Note (Signed)
This note was copied from the chart of BOY,Mariela Meister. Lactation Consultation Note: Initial visit with mom. She reports that baby has been latching well but she is not sure how much he is getting. Has changed several diapers and is content after nursing. Reassurance given. Baby was circ'd this morning. Baby asleep in bassinet and mom trying to take a nap. Reports that she tried to nurse him about 30 minutes ago but he was too sleepy. Encouraged to call for assist prn. Bf brochure given with resources for support after DC.   Patient Name: Rhonda Ramsey, Rhonda Ramsey [id number] Date: 06/01/2013 Reason for consult: Initial assessment   Maternal Data Formula Feeding for Exclusion: No Infant to breast within first hour of birth: Yes  Feeding   LATCH Score/Interventions                      Lactation Tools Discussed/Used     Consult Status Consult Status: Follow-up Date: 06/01/13 Follow-up type: In-patient    Pamelia Hoit 06/01/2013, 11:21 AM

## 2013-06-01 NOTE — Progress Notes (Addendum)
Subjective: Postpartum Day 1: Scheduled repeat cesarean delivery Patient up ad lib, reports no syncope or dizziness.  Voiding without difficulty. + Flatus. Reports most of her pain is localized to the skin just above her incision Feeding:  Breastfeeding Contraceptive plan:  Unknown at this time  Objective: Vital signs in last 24 hours: Temp:  [97.4 F (36.3 C)-98.4 F (36.9 C)] 97.9 F (36.6 C) (12/20 0534) Pulse Rate:  [64-89] 72 (12/20 0534) Resp:  [16-22] 18 (12/20 0534) BP: (96-120)/(50-71) 111/69 mmHg (12/20 0534) SpO2:  [97 %-100 %] 100 % (12/20 0534)  Physical Exam:  General: alert, cooperative and no distress Lochia: appropriate Uterine Fundus: firm Incision: healing well DVT Evaluation: No evidence of DVT seen on physical exam. Negative Homan's sign. JP drain:   N/a   Recent Labs  05/31/13 0550 06/01/13 0607  HGB 9.2* 8.6*  HCT 29.9* 27.8*    Assessment/Plan: Status post Cesarean section day 1. Asymptomatic anemia - FE ordered Doing well postoperatively.  Continue current care. Discharge home tomorrow or Monday    Haroldine Laws 06/01/2013, 7:59 AM

## 2013-06-02 NOTE — Progress Notes (Signed)
Patient ID: Rhonda Ramsey, female   DOB: [birth year], 27 y.o.   MRN: [id number] Subjective: Postpartum Day 2: Cesarean Delivery secondary to: repeat in early labor  Patient reports tolerating PO, + flatus and no problems voiding.  Reports feeling "bloated"  no complaints, up ad lib without syncope Pain well controlled with po meds BF well  Mood stable, bonding well Contraception: considering IUD, discussed mirena and copper IUD, and implanon, states she was previously on nuvaring, and doesn't do well with remembering pills every day   Objective: Vital signs in last 24 hours: Temp:  [97.6 F (36.4 C)-98.6 F (37 C)] 98.6 F (37 C) (12/21 0556) Pulse Rate:  [71-97] 71 (12/21 0556) Resp:  [18] 18 (12/21 0556) BP: (103-111)/(56-66) 111/66 mmHg (12/21 0556) SpO2:  [99 %] 99 % (12/21 0556)  Physical Exam:  General: alert and no distress Heart: RRR Lungs: CTAB Abdomen: BS x4 Uterine Fundus: firm Incision: honeycomb dressing CDI, some old drainage noted on R side Lochia: appropriate DVT Evaluation: No evidence of DVT seen on physical exam. Negative Homan's sign. No significant calf/ankle edema.   Recent Labs  05/31/13 0550 06/01/13 0607  HGB 9.2* 8.6*  HCT 29.9* 27.8*    Assessment/Plan: Status post Cesarean section. Doing well postoperatively.  Continue current care. Discharge home tmrw    Malissa Hippo 06/02/2013, 9:45 AM

## 2013-06-03 ENCOUNTER — Encounter (HOSPITAL_COMMUNITY): Payer: Self-pay | Admitting: Obstetrics and Gynecology

## 2013-06-03 MED ORDER — OXYCODONE-ACETAMINOPHEN 5-325 MG PO TABS: 1.0000 | ORAL_TABLET | ORAL | Status: DC | PRN

## 2013-06-03 MED ORDER — DOCUSATE SODIUM 100 MG PO CAPS: 100.0000 mg | ORAL_CAPSULE | Freq: Two times a day (BID) | ORAL | Status: DC

## 2013-06-03 MED ORDER — IBUPROFEN 800 MG PO TABS: 800.0000 mg | ORAL_TABLET | Freq: Three times a day (TID) | ORAL | Status: DC | PRN

## 2013-06-03 NOTE — Discharge Summary (Signed)
Cesarean Section Delivery Discharge Summary  Rhonda Ramsey  DOB:    [birth year] MRN:    [id number] CSN:    [id number]  Date of admission:                  05/31/2013  Date of discharge:                   06/03/2013  Procedures this admission:  Date of Delivery: 05/31/2013 repeat low transverse cesarean section by Dr. Dois Davenport Rivard  Newborn Data:  Live born female  Birth Weight: 7 lb 13.6 oz (3560 g) APGAR: 9, 10  Home with mother. Name: Rhonda Ramsey Circumcision Plan: Inpatient  History of Present Illness:  Rhonda Ramsey is a 27 y.o. female, [id number], who presents at [redacted]w[redacted]d weeks gestation. The patient has been followed at the [facility] and Gynecology division of [facility].    Her pregnancy has been complicated by:  Patient Active Problem List   Diagnosis Date Noted  . Cesarean delivery delivered 05/31/2013   obesity Anemia Gastroesophageal reflux disease.  Hospital course:  The patient was admitted for a scheduled repeat cesarean delivery. She tolerated her procedure well..   Her postpartum course was not complicated.  She was discharged to home on postpartum day 3 doing well.  Feeding:  breast  Contraception:  condoms  Discharge hemoglobin:  Hemoglobin  Date Value Range Status  06/01/2013 8.6* 12.0 - 15.0 g/dL Final     HCT  Date Value Range Status  06/01/2013 27.8* 36.0 - 46.0 % Final    Discharge Physical Exam:   General: no distress Lochia: appropriate Uterine Fundus: firm Incision: no significant drainage DVT Evaluation: No evidence of DVT seen on physical exam.  Intrapartum Procedures: cesarean: low cervical, transverse Postpartum Procedures: none Complications-Operative and Postpartum: none  Discharge Diagnoses: Term Pregnancy-delivered and Herpesvirus, obesity, prior cesarean section, anemia, gastroesophageal reflux disease  Discharge Information:  Activity:           pelvic rest Diet:                 routine Medications: PNV, Ibuprofen, Colace, Iron and Percocet Condition:      stable and improved Instructions:  Care After Cesarean Delivery  Refer to this sheet in the next few weeks. These instructions provide you with information on caring for yourself after your procedure. Your caregiver may also give you specific instructions. Your treatment has been planned according to current medical practices, but problems sometimes occur. Call your caregiver if you have any problems or questions after you go home. HOME CARE INSTRUCTIONS  Only take over-the-counter or prescription medicines as directed by your caregiver.  Do not drink alcohol, especially if you are breastfeeding or taking medicine to relieve pain.  Do not chew or smoke tobacco.  Continue to use good perineal care. Good perineal care includes:  Wiping your perineum from front to back.  Keeping your perineum clean.  Check your cut (incision) daily for increased redness, drainage, swelling, or separation of skin.  Clean your incision gently with soap and water every day, and then pat it dry. If your caregiver says it is okay, leave the incision uncovered. Use a bandage (dressing) if the incision is draining fluid or appears irritated. If the adhesive strips across the incision do not fall off within 7 days, carefully peel them off.  Hug a pillow when coughing or sneezing until your incision is healed. This helps to relieve pain.  Do not  use tampons or douche until your caregiver says it is okay.  Shower, wash your hair, and take tub baths as directed by your caregiver.  Wear a well-fitting bra that provides breast support.  Limit wearing support panties or control-top hose.  Drink enough fluids to keep your urine clear or pale yellow.  Eat high-fiber foods such as whole grain cereals and breads, brown rice, beans, and fresh fruits and vegetables every day. These foods may help prevent or relieve  constipation.  Resume activities such as climbing stairs, driving, lifting, exercising, or traveling as directed by your caregiver.  Talk to your caregiver about resuming sexual activities. This is dependent upon your risk of infection, your rate of healing, and your comfort and desire to resume sexual activity.  Try to have someone help you with your household activities and your newborn for at least a few days after you leave the hospital.  Rest as much as possible. Try to rest or take a nap when your newborn is sleeping.  Increase your activities gradually.  Keep all of your scheduled postpartum appointments. It is very important to keep your scheduled follow-up appointments. At these appointments, your caregiver will be checking to make sure that you are healing physically and emotionally. SEEK MEDICAL CARE IF:   You are passing large clots from your vagina. Save any clots to show your caregiver.  You have a foul smelling discharge from your vagina.  You have trouble urinating.  You are urinating frequently.  You have pain when you urinate.  You have a change in your bowel movements.  You have increasing redness, pain, or swelling near your incision.  You have pus draining from your incision.  Your incision is separating.  You have painful, hard, or reddened breasts.  You have a severe headache.  You have blurred vision or see spots.  You feel sad or depressed.  You have thoughts of hurting yourself or your newborn.  You have questions about your care, the care of your newborn, or medicines.  You are dizzy or lightheaded.  You have a rash.  You have pain, redness, or swelling at the site of the removed intravenous access (IV) tube.  You have nausea or vomiting.  You stopped breastfeeding and have not had a menstrual period within 12 weeks of stopping.  You are not breastfeeding and have not had a menstrual period within 12 weeks of delivery.  You have a  fever. SEEK IMMEDIATE MEDICAL CARE IF:  You have persistent pain.  You have chest pain.  You have shortness of breath.  You faint.  You have leg pain.  You have stomach pain.  Your vaginal bleeding saturates 2 or more sanitary pads in 1 hour. MAKE SURE YOU:   Understand these instructions.  Will watch your condition.  Will get help right away if you are not doing well or get worse. Document Released: 02/19/2002 Document Revised: 02/22/2012 Document Reviewed: 01/25/2012 [facility] Patient Information 2014 [place], [place].   Postpartum Depression and Baby Blues  The postpartum period begins right after the birth of a baby. During this time, there is often a great amount of joy and excitement. It is also a time of considerable changes in the life of the parent(s). Regardless of how many times a mother gives birth, each child brings new challenges and dynamics to the family. It is not unusual to have feelings of excitement accompanied by confusing shifts in moods, emotions, and thoughts. All mothers are at risk of  developing postpartum depression or the "baby blues." These mood changes can occur right after giving birth, or they may occur many months after giving birth. The baby blues or postpartum depression can be mild or severe. Additionally, postpartum depression can resolve rather quickly, or it can be a long-term condition. CAUSES Elevated hormones and their rapid decline are thought to be a main cause of postpartum depression and the baby blues. There are a number of hormones that radically change during and after pregnancy. Estrogen and progesterone usually decrease immediately after delivering your baby. The level of thyroid hormone and various cortisol steroids also rapidly drop. Other factors that play a major role in these changes include major life events and genetics.  RISK FACTORS If you have any of the following risks for the baby blues or postpartum depression, know what  symptoms to watch out for during the postpartum period. Risk factors that may increase the likelihood of getting the baby blues or postpartum depression include:  Havinga personal or family history of depression.  Having depression while being pregnant.  Having premenstrual or oral contraceptive-associated mood issues.  Having exceptional life stress.  Having marital conflict.  Lacking a social support network.  Having a baby with special needs.  Having health problems such as diabetes. SYMPTOMS Baby blues symptoms include:  Brief fluctuations in mood, such as going from extreme happiness to sadness.  Decreased concentration.  Difficulty sleeping.  Crying spells, tearfulness.  Irritability.  Anxiety. Postpartum depression symptoms typically begin within the first month after giving birth. These symptoms include:  Difficulty sleeping or excessive sleepiness.  Marked weight loss.  Agitation.  Feelings of worthlessness.  Lack of interest in activity or food. Postpartum psychosis is a very concerning condition and can be dangerous. Fortunately, it is rare. Displaying any of the following symptoms is cause for immediate medical attention. Postpartum psychosis symptoms include:  Hallucinations and delusions.  Bizarre or disorganized behavior.  Confusion or disorientation. DIAGNOSIS  A diagnosis is made by an evaluation of your symptoms. There are no medical or lab tests that lead to a diagnosis, but there are various questionnaires that a caregiver may use to identify those with the baby blues, postpartum depression, or psychosis. Often times, a screening tool called the [place] Postnatal Depression Scale is used to diagnose depression in the postpartum period.  TREATMENT The baby blues usually goes away on its own in 1 to 2 weeks. Social support is often all that is needed. You should be encouraged to get adequate sleep and rest. Occasionally, you may be given  medicines to help you sleep.  Postpartum depression requires treatment as it can last several months or longer if it is not treated. Treatment may include individual or group therapy, medicine, or both to address any social, physiological, and psychological factors that may play a role in the depression. Regular exercise, a healthy diet, rest, and social support may also be strongly recommended.  Postpartum psychosis is more serious and needs treatment right away. Hospitalization is often needed. HOME CARE INSTRUCTIONS  Get as much rest as you can. Nap when the baby sleeps.  Exercise regularly. Some women find yoga and walking to be beneficial.  Eat a balanced and nourishing diet.  Do little things that you enjoy. Have a cup of tea, take a bubble bath, read your favorite magazine, or listen to your favorite music.  Avoid alcohol.  Ask for help with household chores, cooking, grocery shopping, or running errands as needed. Do not try to  do everything.  Talk to people close to you about how you are feeling. Get support from your partner, family members, friends, or other new moms.  Try to stay positive in how you think. Think about the things you are grateful for.  Do not spend a lot of time alone.  Only take medicine as directed by your caregiver.  Keep all your postpartum appointments.  Let your caregiver know if you have any concerns. SEEK MEDICAL CARE IF: You are having a reaction or problems with your medicine. SEEK IMMEDIATE MEDICAL CARE IF:  You have suicidal feelings.  You feel you may harm the baby or someone else. Document Released: 03/03/2004 Document Revised: 08/22/2011 Document Reviewed: 04/05/2011 [facility] Patient Information 2014 [place], [place].  Discharge to: home  Follow-up Information   Follow up with [facility] A, MD In 6 weeks.   Specialty:  Obstetrics and Gynecology   Contact information:   [address] [place]  [address] [phone]        Janine Limbo 06/03/2013

## 2013-06-07 NOTE — Progress Notes (Signed)
Post discharge chart review completed.  

## 2013-06-14 ENCOUNTER — Encounter (HOSPITAL_COMMUNITY): Payer: Self-pay | Admitting: *Deleted

## 2013-06-14 ENCOUNTER — Inpatient Hospital Stay (HOSPITAL_COMMUNITY)
Admission: AD | Admit: 2013-06-14 | Discharge: 2013-06-15 | Disposition: A | Discharge status: Home / Self Care | Payer: 59 | Source: Ambulatory Visit | Attending: Obstetrics and Gynecology | Admitting: Obstetrics and Gynecology

## 2013-06-14 DIAGNOSIS — O99893 Other specified diseases and conditions complicating puerperium: Secondary | ICD-10-CM | POA: Insufficient documentation

## 2013-06-14 DIAGNOSIS — R030 Elevated blood-pressure reading, without diagnosis of hypertension: Secondary | ICD-10-CM | POA: Insufficient documentation

## 2013-06-14 DIAGNOSIS — O9989 Other specified diseases and conditions complicating pregnancy, childbirth and the puerperium: Principal | ICD-10-CM

## 2013-06-14 DIAGNOSIS — R51 Headache: Secondary | ICD-10-CM | POA: Insufficient documentation

## 2013-06-14 DIAGNOSIS — O165 Unspecified maternal hypertension, complicating the puerperium: Secondary | ICD-10-CM | POA: Diagnosis present

## 2013-06-14 LAB — COMPREHENSIVE METABOLIC PANEL
ALT: 14 U/L (ref 0–35)
AST: 19 U/L (ref 0–37)
Albumin: 3.4 g/dL — ABNORMAL LOW (ref 3.5–5.2)
Alkaline Phosphatase: 90 U/L (ref 39–117)
BILIRUBIN TOTAL: 0.3 mg/dL (ref 0.3–1.2)
BUN: 12 mg/dL (ref 6–23)
CHLORIDE: 102 meq/L (ref 96–112)
CO2: 27 meq/L (ref 19–32)
Calcium: 9.1 mg/dL (ref 8.4–10.5)
Creatinine, Ser: 0.93 mg/dL (ref 0.50–1.10)
GFR, EST NON AFRICAN AMERICAN: 83 mL/min — AB (ref >90)
GLUCOSE: 80 mg/dL (ref 70–99)
Potassium: 4.2 mEq/L (ref 3.7–5.3)
Sodium: 143 mEq/L (ref 137–147)
Total Protein: 6.9 g/dL (ref 6.0–8.3)

## 2013-06-14 LAB — PROTEIN / CREATININE RATIO, URINE
Creatinine, Urine: 83.88 mg/dL
Total Protein, Urine: <4 mg/dL

## 2013-06-14 LAB — URINE MICROSCOPIC-ADD ON

## 2013-06-14 LAB — CBC
HCT: 30.1 % — ABNORMAL LOW (ref 36.0–46.0)
HEMOGLOBIN: 9 g/dL — AB (ref 12.0–15.0)
MCH: 19.7 pg — ABNORMAL LOW (ref 26.0–34.0)
MCHC: 29.9 g/dL — ABNORMAL LOW (ref 30.0–36.0)
MCV: 65.7 fL — AB (ref 78.0–100.0)
PLATELETS: 247 10*3/uL (ref 150–400)
RBC: 4.58 MIL/uL (ref 3.87–5.11)
RDW: 17.2 % — ABNORMAL HIGH (ref 11.5–15.5)
WBC: 8.7 10*3/uL (ref 4.0–10.5)

## 2013-06-14 LAB — URINALYSIS, ROUTINE W REFLEX MICROSCOPIC
Bilirubin Urine: NEGATIVE
GLUCOSE, UA: NEGATIVE mg/dL
Ketones, ur: 15 mg/dL — AB
LEUKOCYTES UA: NEGATIVE
Nitrite: NEGATIVE
PH: 6 (ref 5.0–8.0)
Protein, ur: NEGATIVE mg/dL
SPECIFIC GRAVITY, URINE: 1.01 (ref 1.005–1.030)
Urobilinogen, UA: 0.2 mg/dL (ref 0.0–1.0)

## 2013-06-14 LAB — LACTATE DEHYDROGENASE: LDH: 240 U/L (ref 94–250)

## 2013-06-14 LAB — URIC ACID: Uric Acid, Serum: 5.3 mg/dL (ref 2.4–7.0)

## 2013-06-14 MED ORDER — LABETALOL HCL 200 MG PO TABS: 200.0000 mg | ORAL_TABLET | Freq: Two times a day (BID) | ORAL | Status: DC

## 2013-06-14 MED ORDER — LABETALOL HCL 100 MG PO TABS
200.0000 mg | ORAL_TABLET | Freq: Two times a day (BID) | ORAL | Status: DC
  Administered 2013-06-15: 200 mg via ORAL
  Filled 2013-06-14: qty 2

## 2013-06-14 NOTE — MAU Note (Addendum)
A nurse came to the house today and I told her i had been having a lot of headaches. She took my B/P and it was high. She called my doc and they told me to come here. I had a repeat C/S 12/19.

## 2013-06-14 NOTE — Progress Notes (Signed)
Pt is trying to Breastfeed infant

## 2013-06-14 NOTE — MAU Provider Note (Signed)
History   28 yo G3P2012 s/p repeat C/S on 12/19 presented today after [facility] [employer] RN noted elevated BP of 160/100 and 150/98 on home visit.  Patient reports mild HA, but denies HA, visual sx, or epigastric pain.    Patient is breastfeeding.  No hx HTN.  Denies any edema.  Chief Complaint  Patient presents with  . Hypertension   HPI:  See above  OB History   Grav Para Term Preterm Abortions TAB SAB Ect Mult Living   3 2 2  1 1    2       Past Medical History  Diagnosis Date  . Previous cesarean section   . GERD (gastroesophageal reflux disease)   . Anemia   . Genital herpes 03/2012  . Abnormal Pap smear 2013    colposcopy    Past Surgical History  Procedure Laterality Date  . Cesarean section    . Colposcopy  2013    WNL  . Cesarean section N/A 05/31/2013    Procedure: REPEAT CESAREAN SECTION;  Surgeon: Alwyn Pea, MD;  [facility];  Service: Obstetrics;  Laterality: N/A;    Family History  Problem Relation Age of Onset  . Hypertension Mother   . Heart disease Maternal Aunt     heart murmur    History  Substance Use Topics  . Smoking status: Never Smoker   . Smokeless tobacco: Not on file  . Alcohol Use: No    Allergies: No Known Allergies  Prescriptions prior to admission  Medication Sig Dispense Refill  . docusate sodium (COLACE) 100 MG capsule Take 1 capsule (100 mg total) by mouth 2 (two) times daily.  60 capsule  2  . Ferrous Sulfate Dried (SLOW IRON PO) Take 1 tablet by mouth daily.      Marland Kitchen ibuprofen (ADVIL,MOTRIN) 800 MG tablet Take 1 tablet (800 mg total) by mouth every 8 (eight) hours as needed.  50 tablet  1  . oxyCODONE-acetaminophen (ROXICET) 5-325 MG per tablet Take 1 tablet by mouth every 4 (four) hours as needed for severe pain.  30 tablet  0  . Prenatal Vit-Fe Fumarate-FA (PRENATAL MULTIVITAMIN) TABS tablet Take 1 tablet by mouth daily at 12 noon.      . ranitidine (ZANTAC) 150 MG tablet Take 150 mg by mouth 2 (two) times daily.         ROS:  Mild HA Physical Exam   Blood pressure 138/86, pulse 89, temperature 98.8 F (37.1 C), resp. rate 18, last menstrual period 08/29/2012, currently breastfeeding.  Physical Exam Chest clear Heart RRR without murmur Abd soft, NT, C/S incision well-healed, NT Fundus firm, lochia scant. Ext DTR 1+ without clonus, trace/1+ edema  Results for orders placed during the hospital encounter of 06/14/13 (from the past 24 hour(s))  CBC     Status: Abnormal   Collection Time    06/14/13  9:00 PM      Result Value Range   WBC 8.7  4.0 - 10.5 K/uL   RBC 4.58  3.87 - 5.11 MIL/uL   Hemoglobin 9.0 (*) 12.0 - 15.0 g/dL   HCT 30.1 (*) 36.0 - 46.0 %   MCV 65.7 (*) 78.0 - 100.0 fL   MCH 19.7 (*) 26.0 - 34.0 pg   MCHC 29.9 (*) 30.0 - 36.0 g/dL   RDW 17.2 (*) 11.5 - 15.5 %   Platelets 247  150 - 400 K/uL  COMPREHENSIVE METABOLIC PANEL     Status: Abnormal   Collection Time  06/14/13  9:00 PM      Result Value Range   Sodium 143  137 - 147 mEq/L   Potassium 4.2  3.7 - 5.3 mEq/L   Chloride 102  96 - 112 mEq/L   CO2 27  19 - 32 mEq/L   Glucose, Bld 80  70 - 99 mg/dL   BUN 12  6 - 23 mg/dL   Creatinine, Ser 0.93  0.50 - 1.10 mg/dL   Calcium 9.1  8.4 - 10.5 mg/dL   Total Protein 6.9  6.0 - 8.3 g/dL   Albumin 3.4 (*) 3.5 - 5.2 g/dL   AST 19  0 - 37 U/L   ALT 14  0 - 35 U/L   Alkaline Phosphatase 90  39 - 117 U/L   Total Bilirubin 0.3  0.3 - 1.2 mg/dL   GFR calc non Af Amer 83 (*) >90 mL/min   GFR calc Af Amer >90  >90 mL/min  LACTATE DEHYDROGENASE     Status: None   Collection Time    06/14/13  9:00 PM      Result Value Range   LDH 240  94 - 250 U/L  URIC ACID     Status: None   Collection Time    06/14/13  9:00 PM      Result Value Range   Uric Acid, Serum 5.3  2.4 - 7.0 mg/dL  PROTEIN / CREATININE RATIO, URINE     Status: None   Collection Time    06/14/13  9:00 PM      Result Value Range   Creatinine, Urine 83.88     Total Protein, Urine <4.0     PROTEIN CREATININE  RATIO         0.00 - 0.15  URINALYSIS, ROUTINE W REFLEX MICROSCOPIC     Status: Abnormal   Collection Time    06/14/13  9:00 PM      Result Value Range   Color, Urine YELLOW  YELLOW   APPearance CLEAR  CLEAR   Specific Gravity, Urine 1.010  1.005 - 1.030   pH 6.0  5.0 - 8.0   Glucose, UA NEGATIVE  NEGATIVE mg/dL   Hgb urine dipstick MODERATE (*) NEGATIVE   Bilirubin Urine NEGATIVE  NEGATIVE   Ketones, ur 15 (*) NEGATIVE mg/dL   Protein, ur NEGATIVE  NEGATIVE mg/dL   Urobilinogen, UA 0.2  0.0 - 1.0 mg/dL   Nitrite NEGATIVE  NEGATIVE   Leukocytes, UA NEGATIVE  NEGATIVE  URINE MICROSCOPIC-ADD ON     Status: None   Collection Time    06/14/13  9:00 PM      Result Value Range   Squamous Epithelial / LPF RARE  RARE   WBC, UA 0-2  <3 WBC/hpf   RBC / HPF 0-2  <3 RBC/hpf   Bacteria, UA RARE  RARE     ED Course  14 days s/p repeat LTCS Elevated BP  Plan: Consulted with Dr. Mancel Bale D/C home on Labetalol 200 mg po BID, #60, 2 refills (gave written Rx per patient request) Will have [facility] [employer] RN see patient on Monday for BP check, with results called to the office. PIH precautions reviewed with patient and family.   Donnel Saxon CNM, [place] 06/14/2013 8:47 PM

## 2013-06-14 NOTE — Discharge Instructions (Signed)

## 2013-06-15 MED ORDER — LABETALOL HCL 200 MG PO TABS: 200.0000 mg | ORAL_TABLET | Freq: Two times a day (BID) | ORAL | Status: DC

## 2013-06-15 NOTE — Progress Notes (Signed)
Windy Kalata CNM in earlier and discussed test results and d/c plan. Written and verbal d/c instructions given and understanding voiced.

## 2014-02-25 ENCOUNTER — Emergency Department (HOSPITAL_COMMUNITY)
Admission: EM | Admit: 2014-02-25 | Discharge: 2014-02-25 | Disposition: A | Discharge status: Home / Self Care | Payer: 59 | Source: Home / Self Care

## 2014-02-25 ENCOUNTER — Other Ambulatory Visit (HOSPITAL_COMMUNITY)
Admission: RE | Admit: 2014-02-25 | Discharge: 2014-02-25 | Disposition: A | Discharge status: Home / Self Care | Payer: 59 | Source: Ambulatory Visit | Attending: Family Medicine | Admitting: Family Medicine

## 2014-02-25 ENCOUNTER — Encounter (HOSPITAL_COMMUNITY): Payer: Self-pay | Admitting: Emergency Medicine

## 2014-02-25 DIAGNOSIS — M545 Low back pain, unspecified: Secondary | ICD-10-CM

## 2014-02-25 DIAGNOSIS — R102 Pelvic and perineal pain: Secondary | ICD-10-CM

## 2014-02-25 DIAGNOSIS — S335XXA Sprain of ligaments of lumbar spine, initial encounter: Secondary | ICD-10-CM

## 2014-02-25 DIAGNOSIS — N760 Acute vaginitis: Secondary | ICD-10-CM | POA: Diagnosis present

## 2014-02-25 DIAGNOSIS — S39012A Strain of muscle, fascia and tendon of lower back, initial encounter: Secondary | ICD-10-CM

## 2014-02-25 DIAGNOSIS — X58XXXA Exposure to other specified factors, initial encounter: Secondary | ICD-10-CM

## 2014-02-25 DIAGNOSIS — Z113 Encounter for screening for infections with a predominantly sexual mode of transmission: Secondary | ICD-10-CM | POA: Diagnosis present

## 2014-02-25 DIAGNOSIS — N949 Unspecified condition associated with female genital organs and menstrual cycle: Secondary | ICD-10-CM

## 2014-02-25 LAB — POCT URINALYSIS DIP (DEVICE)
Bilirubin Urine: NEGATIVE
Glucose, UA: NEGATIVE mg/dL
Hgb urine dipstick: NEGATIVE
Ketones, ur: NEGATIVE mg/dL
LEUKOCYTES UA: NEGATIVE
Nitrite: NEGATIVE
PROTEIN: NEGATIVE mg/dL
SPECIFIC GRAVITY, URINE: 1.015 (ref 1.005–1.030)
Urobilinogen, UA: 0.2 mg/dL (ref 0.0–1.0)
pH: 6 (ref 5.0–8.0)

## 2014-02-25 LAB — POCT PREGNANCY, URINE: Preg Test, Ur: NEGATIVE

## 2014-02-25 NOTE — ED Notes (Signed)
Reports lower back and abdominal pian with nausea.   X  1 month.   Pt has tried ibuprofen and cranberry juice with no relief.  States pain in back is constant dull ache.  Gradually getting worse.  Pt states that she recently restarted nuva ring BC.

## 2014-02-25 NOTE — ED Provider Notes (Signed)
CSN: [id number]     Arrival date & time 02/25/14  1432 History   First MD Initiated Contact with Patient 02/25/14 1631     Chief Complaint  Patient presents with  . Back Pain   (Consider location/radiation/quality/duration/timing/severity/associated sxs/prior Treatment) HPI Comments: Right low back pain for 1 month, dull and sometimes noticed with certain movements. Denies trauma or known injury.   C/O pelvic pain like cramps for 3 months. Started Nuvaring about 2 months ago. Occasional nausea. No vaginal bleeding, discharge or urinary sx's.    Past Medical History  Diagnosis Date  . Previous cesarean section   . GERD (gastroesophageal reflux disease)   . Anemia   . Genital herpes 03/2012  . Abnormal Pap smear 2013    colposcopy   Past Surgical History  Procedure Laterality Date  . Cesarean section    . Colposcopy  2013    WNL  . Cesarean section N/A 05/31/2013    Procedure: REPEAT CESAREAN SECTION;  Surgeon: Alwyn Pea, MD;  Location: [facility];  Service: Obstetrics;  Laterality: N/A;   Family History  Problem Relation Age of Onset  . Hypertension Mother   . Heart disease Maternal Aunt     heart murmur   History  Substance Use Topics  . Smoking status: Never Smoker   . Smokeless tobacco: Not on file  . Alcohol Use: No   OB History   Grav Para Term Preterm Abortions TAB SAB Ect Mult Living   3 2 2  1 1    2      Review of Systems  Constitutional: Negative for fever, activity change and fatigue.  HENT: Negative.   Respiratory: Negative for cough and shortness of breath.   Cardiovascular: Negative for chest pain.  Gastrointestinal: Positive for nausea and abdominal pain. Negative for vomiting.  Genitourinary: Positive for pelvic pain. Negative for dysuria, urgency, frequency, vaginal bleeding, vaginal discharge, difficulty urinating and vaginal pain.  Musculoskeletal: Positive for back pain.  Skin: Negative.   Neurological: Negative.     Allergies  Review of  patient's allergies indicates no known allergies.  Home Medications   Prior to Admission medications   Medication Sig Start Date End Date Taking? Authorizing Provider  ibuprofen (ADVIL,MOTRIN) 800 MG tablet Take 1 tablet (800 mg total) by mouth every 8 (eight) hours as needed. 06/03/13  Yes Ena Dawley, MD  Ferrous Sulfate Dried (SLOW IRON PO) Take 1 tablet by mouth daily.    Historical Provider, MD  labetalol (NORMODYNE) 200 MG tablet Take 1 tablet (200 mg total) by mouth 2 (two) times daily. 06/15/13   Donnel Saxon, CNM  Prenatal Vit-Fe Fumarate-FA (PRENATAL MULTIVITAMIN) TABS tablet Take 1 tablet by mouth daily at 12 noon.    Historical Provider, MD   BP 134/82  Pulse 101  Temp(Src) 98 F (36.7 C) (Oral)  Resp 16  SpO2 99%  LMP 01/31/2014 Physical Exam  Nursing note and vitals reviewed. Constitutional: She is oriented to person, place, and time. She appears well-developed and well-nourished. No distress.  HENT:  Head: Normocephalic and atraumatic.  Eyes: EOM are normal. Pupils are equal, round, and reactive to light.  Neck: Normal range of motion. Neck supple.  Cardiovascular: Normal rate and regular rhythm.   Pulmonary/Chest: Effort normal. No respiratory distress.  Abdominal: Soft. She exhibits no distension and no mass. There is no tenderness. There is no rebound and no guarding.  Genitourinary:  Minor suprapubic tenderness with deep palpation.  NEFG Scant white to gray discharge.  Cx posterior. No lesions.  No CMT or adnexal tenderness.  Nuva ring postioned anterior vagina.  Musculoskeletal: Normal range of motion. She exhibits no edema.  Pain reproduced to the lower back with spinal flexion to 90 deg. Reproducible muscular tenderness L paralumbar muscles.   Neurological: She is alert and oriented to person, place, and time. No cranial nerve deficit. She exhibits normal muscle tone.  Skin: Skin is warm and dry.  Psychiatric: She has a normal mood and affect.    ED  Course  Procedures (including critical care time) Labs Review Labs Reviewed  POCT URINALYSIS DIP (DEVICE)  POCT PREGNANCY, URINE  CERVICOVAGINAL ANCILLARY ONLY   Results for orders placed during the hospital encounter of 02/25/14  POCT URINALYSIS DIP (DEVICE)      Result Value Ref Range   Glucose, UA NEGATIVE  NEGATIVE mg/dL   Bilirubin Urine NEGATIVE  NEGATIVE   Ketones, ur NEGATIVE  NEGATIVE mg/dL   Specific Gravity, Urine 1.015  1.005 - 1.030   Hgb urine dipstick NEGATIVE  NEGATIVE   pH 6.0  5.0 - 8.0   Protein, ur NEGATIVE  NEGATIVE mg/dL   Urobilinogen, UA 0.2  0.0 - 1.0 mg/dL   Nitrite NEGATIVE  NEGATIVE   Leukocytes, UA NEGATIVE  NEGATIVE  POCT PREGNANCY, URINE      Result Value Ref Range   Preg Test, Ur NEGATIVE  NEGATIVE    Imaging Review No results found.   MDM   1. Pelvic pain in female   2. Lumbar strain, initial encounter   3. Right-sided low back pain without sciatica     Reassurance For back heat and stretches, NSAIDS prn No signs of pelvic infection or acute disorder. F/U with PCP.       Janne Napoleon, NP 02/25/14 [place], NP 02/25/14 1719

## 2014-02-25 NOTE — Discharge Instructions (Signed)
Back Pain, Adult °Low back pain is very common. About 1 in 5 people have back pain. The cause of low back pain is rarely dangerous. The pain often gets better over time. About half of people with a sudden onset of back pain feel better in just 2 weeks. About 8 in 10 people feel better by 6 weeks.  °CAUSES °Some common causes of back pain include: °· Strain of the muscles or ligaments supporting the spine. °· Wear and tear (degeneration) of the spinal discs. °· Arthritis. °· Direct injury to the back. °DIAGNOSIS °Most of the time, the direct cause of low back pain is not known. However, back pain can be treated effectively even when the exact cause of the pain is unknown. Answering your caregiver's questions about your overall health and symptoms is one of the most accurate ways to make sure the cause of your pain is not dangerous. If your caregiver needs more information, he or she may order lab work or imaging tests (X-rays or MRIs). However, even if imaging tests show changes in your back, this usually does not require surgery. °HOME CARE INSTRUCTIONS °For many people, back pain returns. Since low back pain is rarely dangerous, it is often a condition that people can learn to manage on their own.  °· Remain active. It is stressful on the back to sit or stand in one place. Do not sit, drive, or stand in one place for more than 30 minutes at a time. Take short walks on level surfaces as soon as pain allows. Try to increase the length of time you walk each day. °· Do not stay in bed. Resting more than 1 or 2 days can delay your recovery. °· Do not avoid exercise or work. Your body is made to move. It is not dangerous to be active, even though your back may hurt. Your back will likely heal faster if you return to being active before your pain is gone. °· Pay attention to your body when you  bend and lift. Many people have less discomfort when lifting if they bend their knees, keep the load close to their bodies, and  avoid twisting. Often, the most comfortable positions are those that put less stress on your recovering back. °· Find a comfortable position to sleep. Use a firm mattress and lie on your side with your knees slightly bent. If you lie on your back, put a pillow under your knees. °· Only take over-the-counter or prescription medicines as directed by your caregiver. Over-the-counter medicines to reduce pain and inflammation are often the most helpful. Your caregiver may prescribe muscle relaxant drugs. These medicines help dull your pain so you can more quickly return to your normal activities and healthy exercise. °· Put ice on the injured area. °¨ Put ice in a plastic bag. °¨ Place a towel between your skin and the bag. °¨ Leave the ice on for 15-20 minutes, 03-04 times a day for the first 2 to 3 days. After that, ice and heat may be alternated to reduce pain and spasms. °· Ask your caregiver about trying back exercises and gentle massage. This may be of some benefit. °· Avoid feeling anxious or stressed. Stress increases muscle tension and can worsen back pain. It is important to recognize when you are anxious or stressed and learn ways to manage it. Exercise is a great option. °SEEK MEDICAL CARE IF: °· You have pain that is not relieved with rest or medicine. °· You have pain that does not improve in 1 week. °· You have new symptoms. °· You are generally not feeling well. °SEEK   IMMEDIATE MEDICAL CARE IF:   You have pain that radiates from your back into your legs.  You develop new bowel or bladder control problems.  You have unusual weakness or numbness in your arms or legs.  You develop nausea or vomiting.  You develop abdominal pain.  You feel faint. Document Released: 05/30/2005 Document Revised: 11/29/2011 Document Reviewed: 10/01/2013 [facility] Patient Information 2015 [place], [place]. This information is not intended to replace advice given to you by your health care provider. Make sure you  discuss any questions you have with your health care provider.  Muscle Strain A muscle strain is an injury that occurs when a muscle is stretched beyond its normal length. Usually a small number of muscle fibers are torn when this happens. Muscle strain is rated in degrees. First-degree strains have the least amount of muscle fiber tearing and pain. Second-degree and third-degree strains have increasingly more tearing and pain.  Usually, recovery from muscle strain takes 1-2 weeks. Complete healing takes 5-6 weeks.  CAUSES  Muscle strain happens when a sudden, violent force placed on a muscle stretches it too far. This may occur with lifting, sports, or a fall.  RISK FACTORS Muscle strain is especially common in athletes.  SIGNS AND SYMPTOMS At the site of the muscle strain, there may be:  Pain.  Bruising.  Swelling.  Difficulty using the muscle due to pain or lack of normal function. DIAGNOSIS  Your health care provider will perform a physical exam and ask about your medical history. TREATMENT  Often, the best treatment for a muscle strain is resting, icing, and applying cold compresses to the injured area.  HOME CARE INSTRUCTIONS   Use the PRICE method of treatment to promote muscle healing during the first 2-3 days after your injury. The PRICE method involves:  Protecting the muscle from being injured again.  Restricting your activity and resting the injured body part.  Icing your injury. To do this, put ice in a plastic bag. Place a towel between your skin and the bag. Then, apply the ice and leave it on from 15-20 minutes each hour. After the third day, switch to moist heat packs.  Apply compression to the injured area with a splint or elastic bandage. Be careful not to wrap it too tightly. This may interfere with blood circulation or increase swelling.  Elevate the injured body part above the level of your heart as often as you can.  Only take over-the-counter or  prescription medicines for pain, discomfort, or fever as directed by your health care provider.  Warming up prior to exercise helps to prevent future muscle strains. SEEK MEDICAL CARE IF:   You have increasing pain or swelling in the injured area.  You have numbness, tingling, or a significant loss of strength in the injured area. MAKE SURE YOU:   Understand these instructions.  Will watch your condition.  Will get help right away if you are not doing well or get worse. Document Released: 05/30/2005 Document Revised: 03/20/2013 Document Reviewed: 12/27/2012 [facility] Patient Information 2015 [place], [place]. This information is not intended to replace advice given to you by your health care provider. Make sure you discuss any questions you have with your health care provider.  Pelvic Pain Female pelvic pain can be caused by many different things and start from a variety of places. Pelvic pain refers to pain that is located in the lower half of the abdomen and between your hips. The pain may occur over a short period  of time (acute) or may be reoccurring (chronic). The cause of pelvic pain may be related to disorders affecting the female reproductive organs (gynecologic), but it may also be related to the bladder, kidney stones, an intestinal complication, or muscle or skeletal problems. Getting help right away for pelvic pain is important, especially if there has been severe, sharp, or a sudden onset of unusual pain. It is also important to get help right away because some types of pelvic pain can be life threatening.  CAUSES  Below are only some of the causes of pelvic pain. The causes of pelvic pain can be in one of several categories.   Gynecologic.  Pelvic inflammatory disease.  Sexually transmitted infection.  Ovarian cyst or a twisted ovarian ligament (ovarian torsion).  Uterine lining that grows outside the uterus (endometriosis).  Fibroids, cysts, or  tumors.  Ovulation.  Pregnancy.  Pregnancy that occurs outside the uterus (ectopic pregnancy).  Miscarriage.  Labor.  Abruption of the placenta or ruptured uterus.  Infection.  Uterine infection (endometritis).  Bladder infection.  Diverticulitis.  Miscarriage related to a uterine infection (septic abortion).  Bladder.  Inflammation of the bladder (cystitis).  Kidney stone(s).  Gastrointestinal.  Constipation.  Diverticulitis.  Neurologic.  Trauma.  Feeling pelvic pain because of mental or emotional causes (psychosomatic).  Cancers of the bowel or pelvis. EVALUATION  Your caregiver will want to take a careful history of your concerns. This includes recent changes in your health, a careful gynecologic history of your periods (menses), and a sexual history. Obtaining your family history and medical history is also important. Your caregiver may suggest a pelvic exam. A pelvic exam will help identify the location and severity of the pain. It also helps in the evaluation of which organ system may be involved. In order to identify the cause of the pelvic pain and be properly treated, your caregiver may order tests. These tests may include:   A pregnancy test.  Pelvic ultrasonography.  An X-ray exam of the abdomen.  A urinalysis or evaluation of vaginal discharge.  Blood tests. HOME CARE INSTRUCTIONS   Only take over-the-counter or prescription medicines for pain, discomfort, or fever as directed by your caregiver.   Rest as directed by your caregiver.   Eat a balanced diet.   Drink enough fluids to make your urine clear or pale yellow, or as directed.   Avoid sexual intercourse if it causes pain.   Apply warm or cold compresses to the lower abdomen depending on which one helps the pain.   Avoid stressful situations.   Keep a journal of your pelvic pain. Write down when it started, where the pain is located, and if there are things that seem to  be associated with the pain, such as food or your menstrual cycle.  Follow up with your caregiver as directed.  SEEK MEDICAL CARE IF:  Your medicine does not help your pain.  You have abnormal vaginal discharge. SEEK IMMEDIATE MEDICAL CARE IF:   You have heavy bleeding from the vagina.   Your pelvic pain increases.   You feel light-headed or faint.   You have chills.   You have pain with urination or blood in your urine.   You have uncontrolled diarrhea or vomiting.   You have a fever or persistent symptoms for more than 3 days.  You have a fever and your symptoms suddenly get worse.   You are being physically or sexually abused.  MAKE SURE YOU:  Understand these instructions.  Will watch your condition.  Will get help if you are not doing well or get worse. Document Released: 04/26/2004 Document Revised: 10/14/2013 Document Reviewed: 09/19/2011 [facility] Patient Information 2015 [place], [place]. This information is not intended to replace advice given to you by your health care provider. Make sure you discuss any questions you have with your health care provider.

## 2014-02-28 NOTE — ED Provider Notes (Signed)
Medical screening examination/treatment/procedure(s) were performed by resident physician or non-physician practitioner and as supervising physician I was immediately available for consultation/collaboration.   Pauline Good MD.   Billy Fischer, MD 02/28/14 [phone]

## 2014-04-14 ENCOUNTER — Encounter (HOSPITAL_COMMUNITY): Payer: Self-pay | Admitting: Emergency Medicine

## 2015-11-03 ENCOUNTER — Inpatient Hospital Stay (HOSPITAL_COMMUNITY): Payer: BLUE CROSS/BLUE SHIELD

## 2015-11-03 ENCOUNTER — Encounter (HOSPITAL_COMMUNITY): Payer: Self-pay | Admitting: *Deleted

## 2015-11-03 ENCOUNTER — Inpatient Hospital Stay (HOSPITAL_COMMUNITY)
Admission: AD | Admit: 2015-11-03 | Discharge: 2015-11-03 | Disposition: A | Discharge status: Home / Self Care | Payer: BLUE CROSS/BLUE SHIELD | Source: Ambulatory Visit | Attending: Obstetrics & Gynecology | Admitting: Obstetrics & Gynecology

## 2015-11-03 DIAGNOSIS — Z3A11 11 weeks gestation of pregnancy: Secondary | ICD-10-CM | POA: Diagnosis not present

## 2015-11-03 DIAGNOSIS — O039 Complete or unspecified spontaneous abortion without complication: Secondary | ICD-10-CM | POA: Diagnosis not present

## 2015-11-03 DIAGNOSIS — A6000 Herpesviral infection of urogenital system, unspecified: Secondary | ICD-10-CM | POA: Diagnosis not present

## 2015-11-03 DIAGNOSIS — O209 Hemorrhage in early pregnancy, unspecified: Secondary | ICD-10-CM | POA: Diagnosis present

## 2015-11-03 DIAGNOSIS — N939 Abnormal uterine and vaginal bleeding, unspecified: Secondary | ICD-10-CM

## 2015-11-03 LAB — CBC
HEMATOCRIT: 37.1 % (ref 36.0–46.0)
HEMOGLOBIN: 11.9 g/dL — AB (ref 12.0–15.0)
MCH: 22.8 pg — ABNORMAL LOW (ref 26.0–34.0)
MCHC: 32.1 g/dL (ref 30.0–36.0)
MCV: 71.2 fL — ABNORMAL LOW (ref 78.0–100.0)
Platelets: 248 10*3/uL (ref 150–400)
RBC: 5.21 MIL/uL — ABNORMAL HIGH (ref 3.87–5.11)
RDW: 14.2 % (ref 11.5–15.5)
WBC: 10.6 10*3/uL — AB (ref 4.0–10.5)

## 2015-11-03 LAB — URINALYSIS, ROUTINE W REFLEX MICROSCOPIC
Bilirubin Urine: NEGATIVE
Glucose, UA: NEGATIVE mg/dL
KETONES UR: NEGATIVE mg/dL
LEUKOCYTES UA: NEGATIVE
NITRITE: NEGATIVE
Protein, ur: NEGATIVE mg/dL
Specific Gravity, Urine: 1.01 (ref 1.005–1.030)
pH: 6.5 (ref 5.0–8.0)

## 2015-11-03 LAB — POCT PREGNANCY, URINE: PREG TEST UR: POSITIVE — AB

## 2015-11-03 LAB — URINE MICROSCOPIC-ADD ON

## 2015-11-03 LAB — HCG, QUANTITATIVE, PREGNANCY: HCG, BETA CHAIN, QUANT, S: 1535 m[IU]/mL — AB (ref <5)

## 2015-11-03 NOTE — MAU Provider Note (Signed)
History   30 yo G4P2012 at 80 4/7 weeks by LMP presented from office for [place] due to bleeding and cramping today.  Unable to ascultate FHR in the office.  Cultures done.  Patient Active Problem List   Diagnosis Date Noted  . Genital HSV 11/04/2015  . Miscarriage 11/04/2015  . 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 Cesarean delivery delivered 05/31/2013    Chief Complaint  Patient presents with  . Vaginal Bleeding   HPI:  As above  OB History    Gravida Para Term Preterm AB TAB SAB Ectopic Multiple Living   4 2 2  1 1    2       Past Medical History  Diagnosis Date  . Previous cesarean section   . GERD (gastroesophageal reflux disease)   . Anemia   . Genital herpes 03/2012  . Abnormal Pap smear 2013    colposcopy    Past Surgical History  Procedure Laterality Date  . Cesarean section    . Colposcopy  2013    WNL  . Cesarean section N/A 05/31/2013    Procedure: REPEAT CESAREAN SECTION;  Surgeon: Alwyn Pea, MD;  Location: [place] [place] [facility];  Service: Obstetrics;  Laterality: N/A;    Family History  Problem Relation Age of Onset  . Hypertension Mother   . Heart disease Maternal Aunt     heart murmur    Social History  Substance Use Topics  . Smoking status: Never Smoker   . Smokeless tobacco: None  . Alcohol Use: No    Allergies: No Known Allergies  No prescriptions prior to admission    ROS:  Spotting, mild cramping Physical Exam   Blood pressure 118/71, pulse 95, temperature 98.8 F (37.1 C), temperature source Oral, resp. rate 18, height 5' (1.524 m), weight 90.81 kg (200 lb 3.2 oz), last menstrual period 08/15/2015, currently breastfeeding.    Physical Exam  In NAD Chest clear Heart RRR without murmur Abd soft, NT Pelvic--deferred in MAU, cervix long and closed at office exam, moderate blood in vault. Ext WNL  [place]:  IUGS 7 4/7 weeks, no fetal pole, QHCG 1535, c/w likely SAB.   Results for orders placed or performed during the hospital encounter  of 11/03/15 (from the past 24 hour(s))  Urinalysis, Routine w reflex microscopic (not at [facility])     Status: Abnormal   Collection Time: 11/03/15  5:18 PM  Result Value Ref Range   Color, Urine YELLOW YELLOW   APPearance CLEAR CLEAR   Specific Gravity, Urine 1.010 1.005 - 1.030   pH 6.5 5.0 - 8.0   Glucose, UA NEGATIVE NEGATIVE mg/dL   Hgb urine dipstick LARGE (A) NEGATIVE   Bilirubin Urine NEGATIVE NEGATIVE   Ketones, ur NEGATIVE NEGATIVE mg/dL   Protein, ur NEGATIVE NEGATIVE mg/dL   Nitrite NEGATIVE NEGATIVE   Leukocytes, UA NEGATIVE NEGATIVE  Urine microscopic-add on     Status: Abnormal   Collection Time: 11/03/15  5:18 PM  Result Value Ref Range   Squamous Epithelial / LPF 0-5 (A) NONE SEEN   WBC, UA 0-5 0 - 5 WBC/hpf   RBC / HPF 0-5 0 - 5 RBC/hpf   Bacteria, UA FEW (A) NONE SEEN  Pregnancy, urine POC     Status: Abnormal   Collection Time: 11/03/15  5:34 PM  Result Value Ref Range   Preg Test, Ur POSITIVE (A) NEGATIVE  hCG, quantitative, pregnancy     Status: Abnormal   Collection Time: 11/03/15  7:56 PM  Result  Value Ref Range   hCG, Beta Chain, Quant, S 1535 (H) <5 mIU/mL  CBC     Status: Abnormal   Collection Time: 11/03/15  7:56 PM  Result Value Ref Range   WBC 10.6 (H) 4.0 - 10.5 K/uL   RBC 5.21 (H) 3.87 - 5.11 MIL/uL   Hemoglobin 11.9 (L) 12.0 - 15.0 g/dL   HCT 37.1 36.0 - 46.0 %   MCV 71.2 (L) 78.0 - 100.0 fL   MCH 22.8 (L) 26.0 - 34.0 pg   MCHC 32.1 30.0 - 36.0 g/dL   RDW 14.2 11.5 - 15.5 %   Platelets 248 150 - 400 K/uL    ED Course  Assessment: Presumptive SAB O+ type  Plan: D/C home.   Plan repeat QHCG on Thursday or Friday for verification of dx of SAB. Plan weekly [facility] after that as needed. Support to patient for loss. Precautions reviewed.  Donnel Saxon CNM, MSN 11/03/15 9p

## 2015-11-03 NOTE — MAU Note (Signed)
Pt states went to ob/gyn on 10/20/15 and confirmed pregnancy. Pt states that she felt a gush of blood while at work that was bright red blood.  Pt states now having clots and back cramping 2/10.  Pt denies dizziness.

## 2015-11-03 NOTE — Discharge Instructions (Signed)

## 2015-11-04 DIAGNOSIS — O039 Complete or unspecified spontaneous abortion without complication: Secondary | ICD-10-CM

## 2015-11-04 DIAGNOSIS — A6000 Herpesviral infection of urogenital system, unspecified: Secondary | ICD-10-CM | POA: Diagnosis not present

## 2015-11-04 HISTORY — DX: Complete or unspecified spontaneous abortion without complication: O03.9

## 2016-09-07 ENCOUNTER — Encounter (HOSPITAL_COMMUNITY): Payer: Self-pay

## 2016-09-12 ENCOUNTER — Encounter: Payer: Self-pay | Admitting: Physician Assistant

## 2016-09-12 ENCOUNTER — Ambulatory Visit (INDEPENDENT_AMBULATORY_CARE_PROVIDER_SITE_OTHER): Payer: 59 | Admitting: Physician Assistant

## 2016-09-12 VITALS — BP 122/80 | HR 92 | Temp 97.3°F | Resp 16 | Ht 60.0 in | Wt 198.4 lb

## 2016-09-12 DIAGNOSIS — K2100 Gastro-esophageal reflux disease with esophagitis, without bleeding: Secondary | ICD-10-CM | POA: Insufficient documentation

## 2016-09-12 DIAGNOSIS — Z0000 Encounter for general adult medical examination without abnormal findings: Secondary | ICD-10-CM

## 2016-09-12 DIAGNOSIS — Z1389 Encounter for screening for other disorder: Secondary | ICD-10-CM | POA: Diagnosis not present

## 2016-09-12 DIAGNOSIS — Z131 Encounter for screening for diabetes mellitus: Secondary | ICD-10-CM

## 2016-09-12 DIAGNOSIS — R6889 Other general symptoms and signs: Secondary | ICD-10-CM

## 2016-09-12 DIAGNOSIS — Z0001 Encounter for general adult medical examination with abnormal findings: Secondary | ICD-10-CM | POA: Diagnosis not present

## 2016-09-12 DIAGNOSIS — Z79899 Other long term (current) drug therapy: Secondary | ICD-10-CM | POA: Diagnosis not present

## 2016-09-12 DIAGNOSIS — R5383 Other fatigue: Secondary | ICD-10-CM

## 2016-09-12 DIAGNOSIS — D649 Anemia, unspecified: Secondary | ICD-10-CM

## 2016-09-12 DIAGNOSIS — Z13220 Encounter for screening for lipoid disorders: Secondary | ICD-10-CM

## 2016-09-12 DIAGNOSIS — E559 Vitamin D deficiency, unspecified: Secondary | ICD-10-CM

## 2016-09-12 LAB — CBC WITH DIFFERENTIAL/PLATELET
BASOS PCT: 0 %
Basophils Absolute: 0 cells/uL (ref 0–200)
EOS PCT: 1 %
Eosinophils Absolute: 79 cells/uL (ref 15–500)
HCT: 40.3 % (ref 35.0–45.0)
HEMOGLOBIN: 12.7 g/dL (ref 11.7–15.5)
LYMPHS ABS: 2923 {cells}/uL (ref 850–3900)
Lymphocytes Relative: 37 %
MCH: 22.8 pg — ABNORMAL LOW (ref 27.0–33.0)
MCHC: 31.5 g/dL — AB (ref 32.0–36.0)
MCV: 72.4 fL — ABNORMAL LOW (ref 80.0–100.0)
MPV: 9.9 fL (ref 7.5–12.5)
Monocytes Absolute: 553 cells/uL (ref 200–950)
Monocytes Relative: 7 %
NEUTROS ABS: 4345 {cells}/uL (ref 1500–7800)
Neutrophils Relative %: 55 %
Platelets: 256 10*3/uL (ref 140–400)
RBC: 5.57 MIL/uL — ABNORMAL HIGH (ref 3.80–5.10)
RDW: 14.7 % (ref 11.0–15.0)
WBC: 7.9 10*3/uL (ref 3.8–10.8)

## 2016-09-12 MED ORDER — BUPROPION HCL ER (XL) 150 MG PO TB24: 150.0000 mg | ORAL_TABLET | ORAL | 2 refills | Status: DC

## 2016-09-12 MED ORDER — PANTOPRAZOLE SODIUM 40 MG PO TBEC: DELAYED_RELEASE_TABLET | ORAL | 0 refills | Status: DC

## 2016-09-12 NOTE — Patient Instructions (Signed)
You can take tylenol (500mg ) or tylenol arthritis (650mg ) with the meloxicam/antiinflammatories. The max you can take of tylenol a day is 3000mg  daily, this is a max of 6 pills a day of the regular tyelnol (500mg ) or a max of 4 a day of the tylenol arthritis (650mg ) as long as no other medications you are taking contain tylenol.    Food Choices for Gastroesophageal Reflux Disease, Adult When you have gastroesophageal reflux disease (GERD), the foods you eat and your eating habits are very important. Choosing the right foods can help ease your discomfort. What guidelines do I need to follow?  Choose fruits, vegetables, whole grains, and low-fat dairy products.  Choose low-fat meat, fish, and poultry.  Limit fats such as oils, salad dressings, butter, nuts, and avocado.  Keep a food diary. This helps you identify foods that cause symptoms.  Avoid foods that cause symptoms. These may be different for everyone.  Eat small meals often instead of 3 large meals a day.  Eat your meals slowly, in a place where you are relaxed.  Limit fried foods.  Cook foods using methods other than frying.  Avoid drinking alcohol.  Avoid drinking large amounts of liquids with your meals.  Avoid bending over or lying down until 2-3 hours after eating. What foods are not recommended? These are some foods and drinks that may make your symptoms worse: Vegetables  Tomatoes. Tomato juice. Tomato and spaghetti sauce. Chili peppers. Onion and garlic. Horseradish. Fruits  Oranges, grapefruit, and lemon (fruit and juice). Meats  High-fat meats, fish, and poultry. This includes hot dogs, ribs, ham, sausage, salami, and bacon. Dairy  Whole milk and chocolate milk. Sour cream. Cream. Butter. Ice cream. Cream cheese. Drinks  Coffee and tea. Bubbly (carbonated) drinks or energy drinks. Condiments  Hot sauce. Barbecue sauce. Sweets/Desserts  Chocolate and cocoa. Donuts. Peppermint and spearmint. Fats and Oils    High-fat foods. This includes [place] fries and potato chips. Other  Vinegar. Strong spices. This includes black pepper, white pepper, red pepper, cayenne, curry powder, cloves, ginger, and chili powder. The items listed above may not be a complete list of foods and drinks to avoid. Contact your dietitian for more information.  This information is not intended to replace advice given to you by your health care provider. Make sure you discuss any questions you have with your health care provider. Document Released: 11/29/2011 Document Revised: 11/05/2015 Document Reviewed: 04/03/2013 Elsevier Interactive Patient Education  2017 [employer].  What is the TMJ? The temporomandibular (tem-PUH-ro-man-DIB-yoo-ler) joint, or the TMJ, connects the upper and lower jawbones. This joint allows the jaw to open wide and move back and forth when you chew, talk, or yawn.There are also several muscles that help this joint move. There can be muscle tightness and pain in the muscle that can cause several symptoms.  What causes TMJ pain? There are many causes of TMJ pain. Repeated chewing (for example, chewing gum) and clenching your teeth can cause pain in the joint. Some TMJ pain has no obvious cause. What can I do to ease the pain? There are many things you can do to help your pain get better. When you have pain:  Eat soft foods and stay away from chewy foods (for example, taffy) Try to use both sides of your mouth to chew Don't chew gum Massage Don't open your mouth wide (for example, during yawning or singing) Don't bite your cheeks or fingernails Lower your amount of stress and worry Applying a warm,  damp washcloth to the joint may help. Over-the-counter pain medicines such as ibuprofen (one brand: Advil) or acetaminophen (one brand: Tylenol) might also help. Do not use these medicines if you are allergic to them or if your doctor told you not to use them. How can I stop the pain from coming back? When  your pain is better, you can do these exercises to make your muscles stronger and to keep the pain from coming back:  Resisted mouth opening: Place your thumb or two fingers under your chin and open your mouth slowly, pushing up lightly on your chin with your thumb. Hold for three to six seconds. Close your mouth slowly. Resisted mouth closing: Place your thumbs under your chin and your two index fingers on the ridge between your mouth and the bottom of your chin. Push down lightly on your chin as you close your mouth. Tongue up: Slowly open and close your mouth while keeping the tongue touching the roof of the mouth. Side-to-side jaw movement: Place an object about one fourth of an inch thick (for example, two tongue depressors) between your front teeth. Slowly move your jaw from side to side. Increase the thickness of the object as the exercise becomes easier Forward jaw movement: Place an object about one fourth of an inch thick between your front teeth and move the bottom jaw forward so that the bottom teeth are in front of the top teeth. Increase the thickness of the object as the exercise becomes easier. These exercises should not be painful. If it hurts to do these exercises, stop doing them and talk to your family doctor.

## 2016-09-12 NOTE — Progress Notes (Signed)
Complete Physical  Assessment and Plan:  Routine general medical examination at a health care facility  Screening for diabetes mellitus -     Hemoglobin A1c -     Insulin, fasting  Screening cholesterol level -     Lipid panel  Medication management -     CBC with Differential/Platelet -     BASIC METABOLIC PANEL WITH GFR -     Hepatic function panel -     Magnesium  Vitamin D deficiency -     VITAMIN D 25 Hydroxy (Vit-D Deficiency, Fractures)  Anemia, unspecified type -     Iron and TIBC -     Ferritin -     Vitamin B12  Other fatigue Fatigue - Discussed lifestyle modification as means of resolving problem, Training modifications discussed, See orders for lab evaluation, Discussed how depression can be a cause of fatigue, will try wellbutrin -     TSH -     buPROPion (WELLBUTRIN XL) 150 MG 24 hr tablet; Take 1 tablet (150 mg total) by mouth every morning.  Screening for hematuria or proteinuria -     Urinalysis, Routine w reflex microscopic -     Microalbumin / creatinine urine ratio  Gastroesophageal reflux disease with esophagitis -     pantoprazole (PROTONIX) 40 MG tablet; Take 1 pill daily for 2 weeks - stop aleve/ibuprofen, do protonix x 2 weeks, diet discussed.   Morbid Obesity with co morbidities - long discussion about weight loss, diet, and exercise  Discussed med's effects and SE's. Screening labs and tests as requested with regular follow-up as recommended. Over 40 minutes of exam, counseling, chart review, and complex, high level critical decision making was performed this visit.   HPI  31 y.o. female  presents for a complete physical and follow up for has Cesarean delivery delivered; Postpartum hypertension; Genital HSV; and Miscarriage on her problem list..  Her blood pressure has been controlled at home, today their BP is BP: 122/80 She does not workout. She denies chest pain, shortness of breath, dizziness.  Single mom, jaylen and Darlyn Chamber, works  at [employer].  States she has been struggling with energy recently, not wanting to do as much x last year. She states she sleeps fine, does not feel rested when she gets up.  Has GERD, has gotten up to zantac 150 x 2 days. Will take ibuprofen 2-3 x a week.  BMI is Body mass index is 38.75 kg/m., she is working on diet and exercise. Wt Readings from Last 3 Encounters:  09/12/16 198 lb 6.4 oz (90 kg)  11/03/15 200 lb 3.2 oz (90.8 kg)  05/31/13 195 lb 9.6 oz (88.7 kg)    Current Medications:  Current Outpatient Prescriptions on File Prior to Visit  Medication Sig Dispense Refill  . ranitidine (ZANTAC) 75 MG tablet Take 75 mg by mouth 2 (two) times daily as needed for heartburn.     Current Facility-Administered Medications on File Prior to Visit  Medication Dose Route Frequency Provider Last Rate Last Dose  . scopolamine (TRANSDERM-SCOP) 1.5 MG 1.5 mg  1 patch Transdermal Once Lyn Hollingshead, MD       Allergies:  No Known Allergies Medical History:  She has Cesarean delivery delivered; Postpartum hypertension; Genital HSV; and Miscarriage on her problem list. Health Maintenance:   There is no immunization history for the selected administration types on file for this patient.  Tetanus: 2014 Pneumovax: N/A Prevnar 13: N/A Flu vaccine: declines Zostavax:N/A  Patient's last menstrual period was 08/26/2016. Pap: q 2 years, follows with Dr. Charlesetta Garibaldi at [place]: N/A DEXA: N/A Colonoscopy:N/A EGD: N/A  Patient Care Team: Unk Pinto, MD as PCP - General (Internal Medicine)  Surgical History:  She has a past surgical history that includes Cesarean section; Colposcopy (2013); and Cesarean section (N/A, 05/31/2013). Family History:  Herfamily history includes Heart disease in her maternal aunt; Hypertension in her mother. Social History:  She reports that she has never smoked. She has never used smokeless tobacco. She reports that she  does not drink alcohol or use drugs.  Review of Systems: Review of Systems  Constitutional: Positive for malaise/fatigue. Negative for chills, diaphoresis, fever and weight loss.  HENT: Negative.   Eyes: Negative.   Respiratory: Negative.   Cardiovascular: Negative.   Gastrointestinal: Positive for heartburn. Negative for abdominal pain, blood in stool, constipation, diarrhea, melena, nausea and vomiting.  Genitourinary: Negative.   Musculoskeletal: Negative.   Skin: Negative.   Neurological: Negative for weakness.    Physical Exam: Estimated body mass index is 38.75 kg/m as calculated from the following:   Height as of this encounter: 5' (1.524 m).   Weight as of this encounter: 198 lb 6.4 oz (90 kg). BP 122/80   Pulse 92   Temp 97.3 F (36.3 C)   Resp 16   Ht 5' (1.524 m)   Wt 198 lb 6.4 oz (90 kg)   LMP 08/26/2016   SpO2 96%   BMI 38.75 kg/m  General Appearance: Well nourished, in no apparent distress.  Eyes: PERRLA, EOMs, conjunctiva no swelling or erythema, normal fundi and vessels.  Sinuses: No Frontal/maxillary tenderness  ENT/Mouth: Ext aud canals clear, normal light reflex with TMs without erythema, bulging. Good dentition. No erythema, swelling, or exudate on post pharynx. Tonsils not swollen or erythematous. Hearing normal.  Neck: Supple, thyroid normal. No bruits  Respiratory: Respiratory effort normal, BS equal bilaterally without rales, rhonchi, wheezing or stridor.  Cardio: RRR without murmurs, rubs or gallops. Brisk peripheral pulses without edema.  Chest: symmetric, with normal excursions and percussion.  Breasts: Symmetric, without lumps, nipple discharge, retractions.  Abdomen: Soft, nontender, no guarding, rebound, hernias, masses, or organomegaly.  Lymphatics: Non tender without lymphadenopathy.  Genitourinary:  Musculoskeletal: Full ROM all peripheral extremities,5/5 strength, and normal gait.  Skin: Warm, dry without rashes, lesions,  ecchymosis. Neuro: Cranial nerves intact, reflexes equal bilaterally. Normal muscle tone, no cerebellar symptoms. Sensation intact.  Psych: Awake and oriented X 3, normal affect, Insight and Judgment appropriate.   EKG: WNL no ST changes. AORTA SCAN: WNL   Vicie Mutters 11:30 AM [facility]

## 2016-09-13 LAB — HEPATIC FUNCTION PANEL
ALT: 14 U/L (ref 6–29)
AST: 16 U/L (ref 10–30)
Albumin: 4 g/dL (ref 3.6–5.1)
Alkaline Phosphatase: 54 U/L (ref 33–115)
BILIRUBIN DIRECT: 0.1 mg/dL (ref <=0.2)
BILIRUBIN INDIRECT: 0.2 mg/dL (ref 0.2–1.2)
Total Bilirubin: 0.3 mg/dL (ref 0.2–1.2)
Total Protein: 7.1 g/dL (ref 6.1–8.1)

## 2016-09-13 LAB — LIPID PANEL
CHOL/HDL RATIO: 1.8 ratio (ref <5.0)
CHOLESTEROL: 131 mg/dL (ref <200)
HDL: 73 mg/dL (ref >50)
LDL Cholesterol: 38 mg/dL (ref <100)
Triglycerides: 98 mg/dL (ref <150)
VLDL: 20 mg/dL (ref <30)

## 2016-09-13 LAB — URINALYSIS, MICROSCOPIC ONLY
Casts: NONE SEEN [LPF]
Crystals: NONE SEEN [HPF]
Yeast: NONE SEEN [HPF]

## 2016-09-13 LAB — BASIC METABOLIC PANEL WITH GFR
BUN: 10 mg/dL (ref 7–25)
CALCIUM: 9.4 mg/dL (ref 8.6–10.2)
CO2: 26 mmol/L (ref 20–31)
Chloride: 104 mmol/L (ref 98–110)
Creat: 0.88 mg/dL (ref 0.50–1.10)
GFR, EST NON AFRICAN AMERICAN: 88 mL/min (ref >=60)
GFR, Est African American: >89 mL/min (ref >=60)
Glucose, Bld: 86 mg/dL (ref 65–99)
POTASSIUM: 4 mmol/L (ref 3.5–5.3)
SODIUM: 140 mmol/L (ref 135–146)

## 2016-09-13 LAB — HEMOGLOBIN A1C
Hgb A1c MFr Bld: 5 % (ref <5.7)
MEAN PLASMA GLUCOSE: 97 mg/dL

## 2016-09-13 LAB — MICROALBUMIN / CREATININE URINE RATIO
CREATININE, URINE: 72 mg/dL (ref 20–320)
MICROALB UR: 0.2 mg/dL
MICROALB/CREAT RATIO: 3 ug/mg{creat} (ref <30)

## 2016-09-13 LAB — URINALYSIS, ROUTINE W REFLEX MICROSCOPIC
BILIRUBIN URINE: NEGATIVE
Glucose, UA: NEGATIVE
Hgb urine dipstick: NEGATIVE
Ketones, ur: NEGATIVE
NITRITE: NEGATIVE
Protein, ur: NEGATIVE
SPECIFIC GRAVITY, URINE: 1.008 (ref 1.001–1.035)
pH: 6.5 (ref 5.0–8.0)

## 2016-09-13 LAB — IRON AND TIBC
%SAT: 22 % (ref 11–50)
Iron: 105 ug/dL (ref 40–190)
TIBC: 470 ug/dL — ABNORMAL HIGH (ref 250–450)
UIBC: 365 ug/dL (ref 125–400)

## 2016-09-13 LAB — VITAMIN B12: Vitamin B-12: 407 pg/mL (ref 200–1100)

## 2016-09-13 LAB — FERRITIN: Ferritin: 17 ng/mL (ref 10–154)

## 2016-09-13 LAB — INSULIN, FASTING: Insulin fasting, serum: 37.4 u[IU]/mL — ABNORMAL HIGH (ref 2.0–19.6)

## 2016-09-13 LAB — TSH: TSH: 1.4 mIU/L

## 2016-09-13 LAB — MAGNESIUM: Magnesium: 2 mg/dL (ref 1.5–2.5)

## 2016-09-14 ENCOUNTER — Encounter: Payer: Self-pay | Admitting: Physician Assistant

## 2016-09-16 LAB — VITAMIN D 25 HYDROXY (VIT D DEFICIENCY, FRACTURES): Vit D, 25-Hydroxy: 22 ng/mL — ABNORMAL LOW (ref 30–100)

## 2016-10-12 ENCOUNTER — Other Ambulatory Visit: Payer: Self-pay | Admitting: Physician Assistant

## 2016-10-12 DIAGNOSIS — K2100 Gastro-esophageal reflux disease with esophagitis, without bleeding: Secondary | ICD-10-CM

## 2016-10-16 NOTE — Progress Notes (Signed)
   Assessment and Plan: 1. Vitamin D deficiency Continue vitamin D   2. Other fatigue Continue vitamins Continue wellbutrin Follow up 3-4 months  3. Morbidly obese ([place]) - long discussion about weight loss, diet, and exercise  4. Seasonal allergic rhinitis, unspecified trigger If not better will send in ABX - predniSONE (DELTASONE) 20 MG tablet; 2 tablets daily for 3 days, 1 tablet daily for 4 days.  Dispense: 10 tablet; Refill: 0 - dexamethasone (DECADRON) injection 10 mg; Inject 1 mL (10 mg total) into the muscle once.    HPI 31 y.o.female presents for 1 month follow up for start on wellbutrin. She states that she is feeling more motivated and tolerating it well. Has had bad allergies for 1 week, cough keeping her up at night.   BMI is Body mass index is 38.83 kg/m., she is working on diet and exercise. Wt Readings from Last 3 Encounters:  10/17/16 198 lb 12.8 oz (90.2 kg)  09/12/16 198 lb 6.4 oz (90 kg)  11/03/15 200 lb 3.2 oz (90.8 kg)    Blood pressure 118/76, pulse 66, temperature 98.1 F (36.7 C), resp. rate 16, height 5' (1.524 m), weight 198 lb 12.8 oz (90.2 kg), last menstrual period 09/23/2016, SpO2 97 %, unknown if currently breastfeeding.    Past Medical History:  Diagnosis Date  . Abnormal Pap smear 2013   colposcopy  . Anemia   . Genital herpes 03/2012  . GERD (gastroesophageal reflux disease)   . Hypertension    during pregnancy and for several months after  . Migraines   . Previous cesarean section      No Known Allergies  Current Outpatient Prescriptions on File Prior to Visit  Medication Sig  . buPROPion (WELLBUTRIN XL) 150 MG 24 hr tablet Take 1 tablet (150 mg total) by mouth every morning.  . Etonogestrel-Ethinyl Estradiol (NUVARING VA) Place vaginally.  Marland Kitchen ibuprofen (ADVIL,MOTRIN) 200 MG tablet Take 200 mg by mouth 3 (three) times daily.  . pantoprazole (PROTONIX) 40 MG tablet TAKE 1 TABLET BY MOUTH EVERY DAY FOR 2 WEEKS  . ranitidine  (ZANTAC) 75 MG tablet Take 75 mg by mouth 2 (two) times daily as needed for heartburn.   Current Facility-Administered Medications on File Prior to Visit  Medication  . scopolamine (TRANSDERM-SCOP) 1.5 MG 1.5 mg    ROS: all negative except above.   Physical Exam: There were no vitals filed for this visit. There were no vitals taken for this visit. General Appearance: Well nourished, in no apparent distress. Eyes: PERRLA, EOMs, conjunctiva no swelling or erythema Sinuses: No Frontal/maxillary tenderness ENT/Mouth: Ext aud canals clear, TMs without erythema, bulging. No erythema, swelling, or exudate on post pharynx.  Tonsils not swollen or erythematous. Hearing normal.  Neck: Supple, thyroid normal.  Respiratory: Respiratory effort normal, BS equal bilaterally without rales, rhonchi, wheezing or stridor.  Cardio: RRR with no MRGs. Brisk peripheral pulses without edema.  Abdomen: Soft, + BS.  Non tender, no guarding, rebound, hernias, masses. Lymphatics: Non tender without lymphadenopathy.  Musculoskeletal: Full ROM, 5/5 strength, normal gait.  Skin: Warm, dry without rashes, lesions, ecchymosis.  Neuro: Cranial nerves intact. Normal muscle tone, no cerebellar symptoms. Sensation intact.  Psych: Awake and oriented X 3, normal affect, Insight and Judgment appropriate.     Vicie Mutters, PA-C 3:42 PM [facility]

## 2016-10-17 ENCOUNTER — Encounter: Payer: Self-pay | Admitting: Physician Assistant

## 2016-10-17 ENCOUNTER — Ambulatory Visit (INDEPENDENT_AMBULATORY_CARE_PROVIDER_SITE_OTHER): Payer: 59 | Admitting: Physician Assistant

## 2016-10-17 VITALS — BP 118/76 | HR 66 | Temp 98.1°F | Resp 16 | Ht 60.0 in | Wt 198.8 lb

## 2016-10-17 DIAGNOSIS — J302 Other seasonal allergic rhinitis: Secondary | ICD-10-CM

## 2016-10-17 DIAGNOSIS — E559 Vitamin D deficiency, unspecified: Secondary | ICD-10-CM | POA: Diagnosis not present

## 2016-10-17 DIAGNOSIS — R5383 Other fatigue: Secondary | ICD-10-CM

## 2016-10-17 MED ORDER — DEXAMETHASONE SODIUM PHOSPHATE 100 MG/10ML IJ SOLN: 10.0000 mg | Freq: Once | INTRAMUSCULAR | Status: AC

## 2016-10-17 MED ORDER — PROMETHAZINE-DM 6.25-15 MG/5ML PO SYRP: 5.0000 mL | ORAL_SOLUTION | Freq: Four times a day (QID) | ORAL | 1 refills | Status: DC | PRN

## 2016-10-17 MED ORDER — PREDNISONE 20 MG PO TABS: ORAL_TABLET | ORAL | 0 refills | Status: DC

## 2016-10-17 NOTE — Patient Instructions (Addendum)
Continue wellbutrin  Make sure you are on an allergy pill, see below for more details. Please take the prednisone as directed below, this is NOT an antibiotic so you do NOT have to finish it. You can take it for a few days and stop it if you are doing better.   Please take the prednisone to help decrease inflammation and therefore decrease symptoms. Take it it with food to avoid GI upset. It can cause increased energy but on the other hand it can make it hard to sleep at night so please take it AT [place], it takes 8-12 hours to start working so it will NOT affect your sleeping if you take it at night with your food!!  If you are diabetic it will increase your sugars so decrease carbs and monitor your sugars closely.     HOW TO TREAT VIRAL COUGH AND COLD SYMPTOMS:  -Symptoms usually last at least 1 week with the worst symptoms being around day 4.  - colds usually start with a sore throat and end with a cough, and the cough can take 2 weeks to get better.  -No antibiotics are needed for colds, flu, sore throats, cough, bronchitis UNLESS symptoms are longer than 7 days OR if you are getting better then get drastically worse.  -There are a lot of combination medications (Dayquil, Nyquil, Vicks 44, tyelnol cold and sinus, ETC). Please look at the ingredients on the back so that you are treating the correct symptoms and not doubling up on medications/ingredients.    Medicines you can use  Nasal congestion  - pseudoephedrine (Sudafed)- behind the counter, do not use if you have high blood pressure, medicine that have -D in them.  - phenylephrine (Sudafed PE) -Dextormethorphan + chlorpheniramine (Coridcidin HBP)- okay if you have high blood pressure -Oxymetazoline (Afrin) nasal spray- LIMIT to 3 days -Saline nasal spray -Neti pot (used distilled or bottled water)  Ear pain/congestion  -pseudoephedrine (sudafed) - Nasonex/flonase nasal spray  Fever  -Acetaminophen  (Tyelnol) -Ibuprofen (Advil, motrin, aleve)  Sore Throat  -Acetaminophen (Tyelnol) -Ibuprofen (Advil, motrin, aleve) -Drink a lot of water -Gargle with salt water - Rest your voice (don't talk) -Throat sprays -Cough drops  Body Aches  -Acetaminophen (Tyelnol) -Ibuprofen (Advil, motrin, aleve)  Headache  -Acetaminophen (Tyelnol) -Ibuprofen (Advil, motrin, aleve) - Exedrin, Exedrin Migraine  Allergy symptoms (cough, sneeze, runny nose, itchy eyes) -Claritin or loratadine cheapest but likely the weakest  -Zyrtec or certizine at night because it can make you sleepy -The strongest is allegra or fexafinadine  Cheapest at [facility], [employer], [employer]  Cough  -Dextromethorphan (Delsym)- medicine that has DM in it -Guafenesin (Mucinex/Robitussin) - cough drops - drink lots of water  Chest Congestion  -Guafenesin (Mucinex/Robitussin)  Red Itchy Eyes  - Naphcon-A  Upset Stomach  - Bland diet (nothing spicy, greasy, fried, and high acid foods like tomatoes, oranges, berries) -OKAY- cereal, bread, soup, crackers, rice -Eat smaller more frequent meals -reduce caffeine, no alcohol -Loperamide (Imodium-AD) if diarrhea -Prevacid for heart burn  General health when sick  -Hydration -wash your hands frequently -keep surfaces clean -change pillow cases and sheets often -Get fresh air but do not exercise strenuously -Vitamin D, double up on it - Vitamin C -Zinc     Your ears and sinuses are connected by the eustachian tube. When your sinuses are inflamed, this can close off the tube and cause fluid to collect in your middle ear. This can then cause dizziness, popping, clicking, ringing, and  echoing in your ears. This is often NOT an infection and does NOT require antibiotics, it is caused by inflammation so the treatments help the inflammation. This can take a long time to get better so please be patient.  Here are things you can do to help with this: - Try the Flonase or Nasonex.  Remember to spray each nostril twice towards the outer part of your eye.  Do not sniff but instead pinch your nose and tilt your head back to help the medicine get into your sinuses.  The best time to do this is at bedtime.Stop if you get blurred vision or nose bleeds.  -While drinking fluids, pinch and hold nose close and swallow, to help open eustachian tubes to drain fluid behind ear drums. -Please pick one of the over the counter allergy medications below and take it once daily for allergies.  It will also help with fluid behind ear drums. Claritin or loratadine cheapest but likely the weakest  Zyrtec or certizine at night because it can make you sleepy The strongest is allegra or fexafinadine  Cheapest at [employer], [employer], [employer] -can use decongestant over the counter, please do not use if you have high blood pressure or certain heart conditions.   if worsening HA, changes vision/speech, imbalance, weakness go to the ER  AT LEAST 64 oz, and aim for 80 oz Eat 3 meals a day Do not drink sodas or any calories    Simple math prevails.    1st - exercise does not produce significant weight loss - at best one converts fat into muscle , "bulks up", loses inches, but usually stays "weight neutral"     2nd - think of your body weightas a check book: If you eat more calories than you burn up - you save money or gain weight .... Or if you spend more money than you put in the check book, ie burn up more calories than you eat, then you lose weight     3rd - if you walk or run 1 mile, you burn up 100 calories - you have to burn up 3,500 calories to lose 1 pound, ie you have to walk/run 35 miles to lose 1 measly pound. So if you want to lose 10 #, then you have to walk/run 350 miles, so.... clearly exercise is not the solution.     4. So if you consume 1,500 calories, then you have to burn up the equivalent of 15 miles to stay weight neutral - It also stands to reason that if you consume 1,500 cal/day  and don't lose weight, then you must be burning up about 1,500 cals/day to stay weight neutral.     5. If you really want to lose weight, you must cut your calorie intake 300 calories /day and at that rate you should lose about 1 # every 3 days.   6. Please purchase Dr Fara Olden Fuhrman's book(s) "The End of Dieting" & "Eat to Live" . It has some great concepts and recipes.

## 2016-10-31 ENCOUNTER — Encounter: Payer: Self-pay | Admitting: Physician Assistant

## 2016-10-31 MED ORDER — LEVOCETIRIZINE DIHYDROCHLORIDE 5 MG PO TABS: 5.0000 mg | ORAL_TABLET | Freq: Every evening | ORAL | 1 refills | Status: DC

## 2016-11-25 ENCOUNTER — Other Ambulatory Visit: Payer: Self-pay | Admitting: Physician Assistant

## 2016-11-25 DIAGNOSIS — R5383 Other fatigue: Secondary | ICD-10-CM

## 2017-01-04 ENCOUNTER — Other Ambulatory Visit: Payer: Self-pay | Admitting: Physician Assistant

## 2017-01-04 MED ORDER — ETONOGESTREL-ETHINYL ESTRADIOL 0.12-0.015 MG/24HR VA RING: 1.0000 | VAGINAL_RING | VAGINAL | 4 refills | Status: DC

## 2017-01-08 ENCOUNTER — Other Ambulatory Visit: Payer: Self-pay | Admitting: Physician Assistant

## 2017-01-08 DIAGNOSIS — K2100 Gastro-esophageal reflux disease with esophagitis, without bleeding: Secondary | ICD-10-CM

## 2017-02-19 NOTE — Progress Notes (Deleted)
FOLLOW UP  Assessment and Plan:    Postpartum Hypertension  Continue medication, monitor blood pressure at home.  Continue DASH diet.    Reminder to go to the ER if any CP, SOB, nausea, dizziness, severe HA, changes vision/speech, left arm numbness and tingling and jaw pain.  GERD with Esophagitis  Well managed with current plan: Zantac 75 mg BID PRN  Discussed avoiding aggravating foods, large meals and lying down after eating  Morbid Obesity with co morbidities  Long discussion about weight loss, diet, and exercise  Patient goals:  Vitamin D Def  Well managed with current plan:   Continue medications.   Check Vit D level  Fatigue  ***Well managed with current plan: Wellbutrin XR 150 mg daily    Continue diet and meds as discussed. Further disposition pending results of labs. Discussed med's effects and SE's.   Over 30 minutes of exam, counseling, chart review, and critical decision making was performed.   Future Appointments Date Time Provider Arthur  02/20/2017 2:30 PM Vicie Mutters, PA-C [facility] None      ----------------------------------------------------------------------------------------------------------------------  HPI 31 y.o. female  presents for 3 month follow up on postpartum hypertension, GERD and vitamin D deficiency, as well as fatigue/possible postpartum depression*** for which she was started on wellbutrin XL 150 mg tab daily which she {HAS HAS NOT:18834} been taking regularly and reports {ACTION; IS/IS RUE:[id number]} helping her symptoms.   She was also initiated on zantac 75 mg BID PRN for GERD; she has been taking this medication***. She notes her symptoms are typically aggravated by***  Her blood pressure {HAS HAS NOT:18834} been controlled at home, today their BP is      She {DOES_DOES BJY:78295} workout. She denies chest pain, shortness of breath, claudication, dizziness.  She {DOES_DOES AOZ:30865} follow a diet  plan which focuses on:  Water intake***   Patient is on Vitamin D supplement***. Most recent lab value was below goal:  Lab Results  Component Value Date   VD25OH 22 (L) 09/12/2016         Current Medications:  Current Outpatient Prescriptions on File Prior to Visit  Medication Sig  . buPROPion (WELLBUTRIN XL) 150 MG 24 hr tablet TAKE 1 TABLET BY MOUTH EVERY MORNING  . etonogestrel-ethinyl estradiol (NUVARING) 0.12-0.015 MG/24HR vaginal ring Place 1 each vaginally every 28 (twenty-eight) days.  Marland Kitchen ibuprofen (ADVIL,MOTRIN) 200 MG tablet Take 200 mg by mouth 3 (three) times daily.  Marland Kitchen levocetirizine (XYZAL) 5 MG tablet Take 1 tablet (5 mg total) by mouth every evening.  . pantoprazole (PROTONIX) 40 MG tablet TAKE 1 TABLET BY MOUTH EVERY DAY FOR 2 WEEKS  . predniSONE (DELTASONE) 20 MG tablet 2 tablets daily for 3 days, 1 tablet daily for 4 days.  . promethazine-dextromethorphan (PROMETHAZINE-DM) 6.25-15 MG/5ML syrup Take 5 mLs by mouth 4 (four) times daily as needed for cough.  . ranitidine (ZANTAC) 75 MG tablet Take 75 mg by mouth 2 (two) times daily as needed for heartburn.   Current Facility-Administered Medications on File Prior to Visit  Medication  . dexamethasone (DECADRON) injection 10 mg  . scopolamine (TRANSDERM-SCOP) 1.5 MG 1.5 mg     Allergies: No Known Allergies   Medical History:  Past Medical History:  Diagnosis Date  . Abnormal Pap smear 2013   colposcopy  . Anemia   . Genital herpes 03/2012  . GERD (gastroesophageal reflux disease)   . Hypertension    during pregnancy and for several months after  . Migraines   .  Previous cesarean section    Family history- Reviewed and unchanged Social history- Reviewed and unchanged   Review of Systems:  ROS    Physical Exam:  There were no vitals taken for this visit. Wt Readings from Last 3 Encounters:  10/17/16 198 lb 12.8 oz (90.2 kg)  09/12/16 198 lb 6.4 oz (90 kg)  11/03/15 200 lb 3.2 oz (90.8 kg)    General Appearance: Well nourished, in no apparent distress. Eyes: PERRLA, EOMs, conjunctiva no swelling or erythema Sinuses: No Frontal/maxillary tenderness ENT/Mouth: Ext aud canals clear, TMs without erythema, bulging. No erythema, swelling, or exudate on post pharynx.  Tonsils not swollen or erythematous. Hearing normal.  Neck: Supple, thyroid normal.  Respiratory: Respiratory effort normal, BS equal bilaterally without rales, rhonchi, wheezing or stridor.  Cardio: RRR with no MRGs. Brisk peripheral pulses without edema.  Abdomen: Soft, + BS.  Non tender, no guarding, rebound, hernias, masses. Lymphatics: Non tender without lymphadenopathy.  Musculoskeletal: Full ROM, 5/5 strength, {PSY - GAIT AND STATION:22860} gait Skin: Warm, dry without rashes, lesions, ecchymosis.  Neuro: Grossly intact.  Psych: Awake and oriented X 3, normal affect, Insight and Judgment appropriate.    Izora Ribas, NP 3:23 PM [facility]

## 2017-02-20 ENCOUNTER — Ambulatory Visit: Payer: Self-pay | Admitting: Physician Assistant

## 2017-03-15 IMAGING — US US OB COMP LESS 14 WK
1 series · 15 of 28 positions shown · non-contrast
Comparison: None.

CLINICAL DATA: Vaginal bleeding for 1 day

EXAM:
OBSTETRIC <14 WK US AND TRANSVAGINAL OB US
TECHNIQUE: Both transabdominal and transvaginal ultrasound examinations were
performed for complete evaluation of the gestation as well as the
maternal uterus, adnexal regions, and pelvic cul-de-sac.
Transvaginal technique was performed to assess early pregnancy.

[Series 1: us ob comp less 14 wk · 15 of 44 slices shown]
[im 1/44]
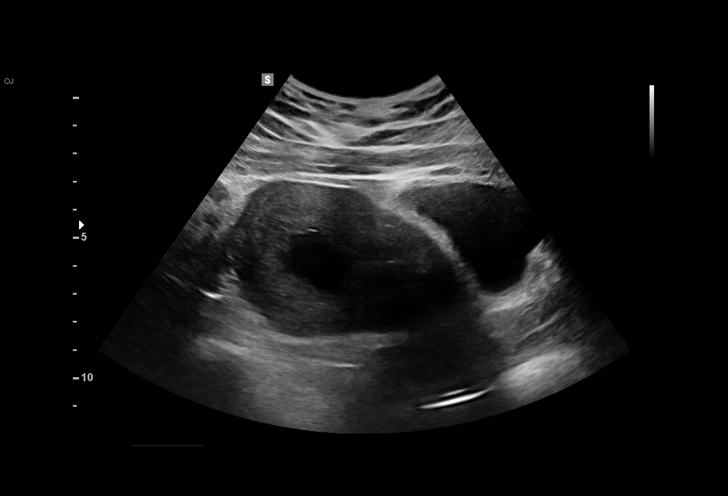
[im 4/44]
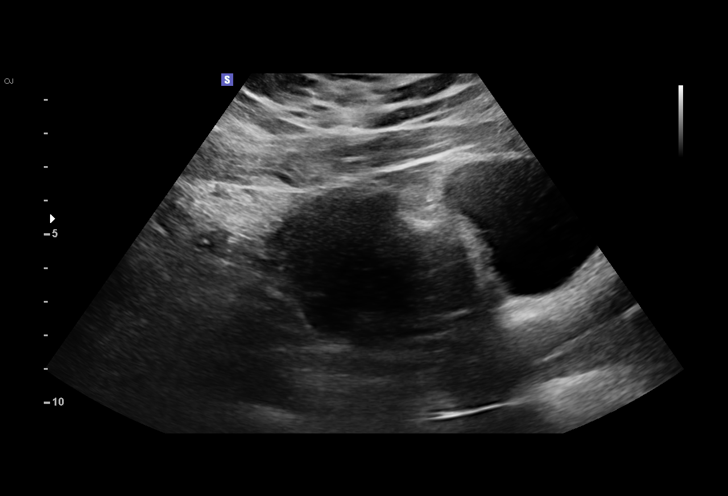
[im 7/44]
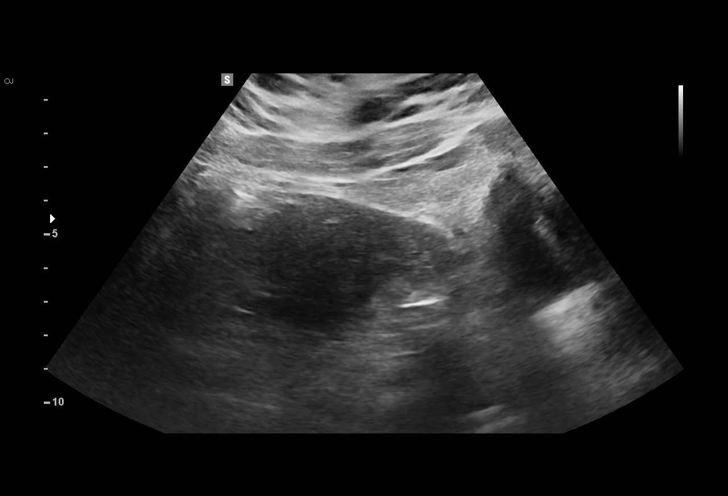
[im 10/44]
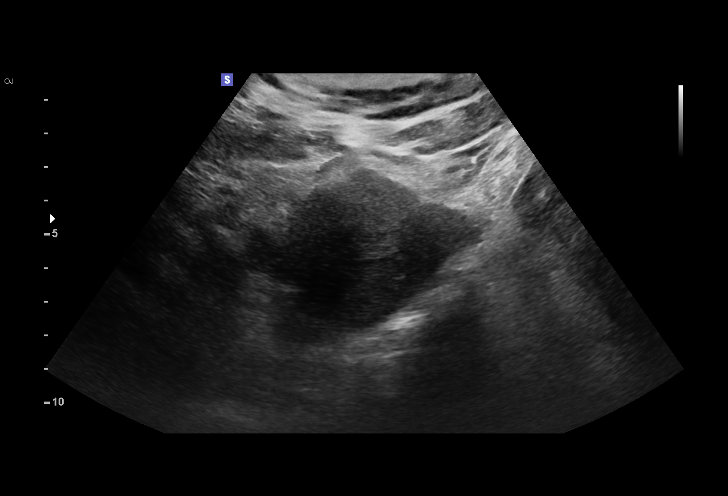
[im 13/44]
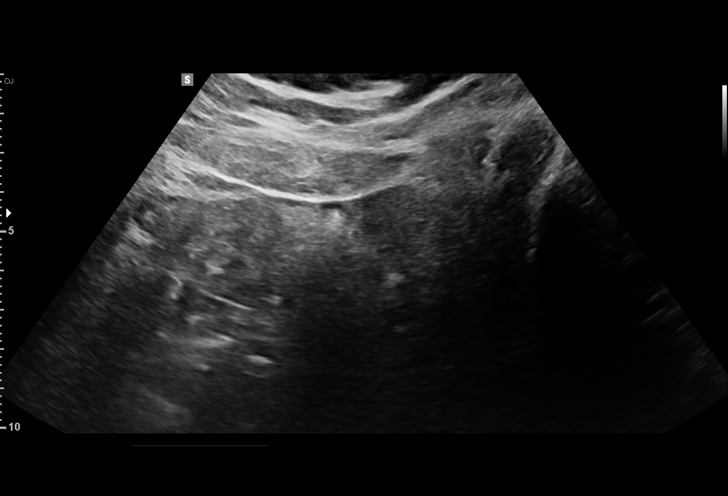
[im 16/44]
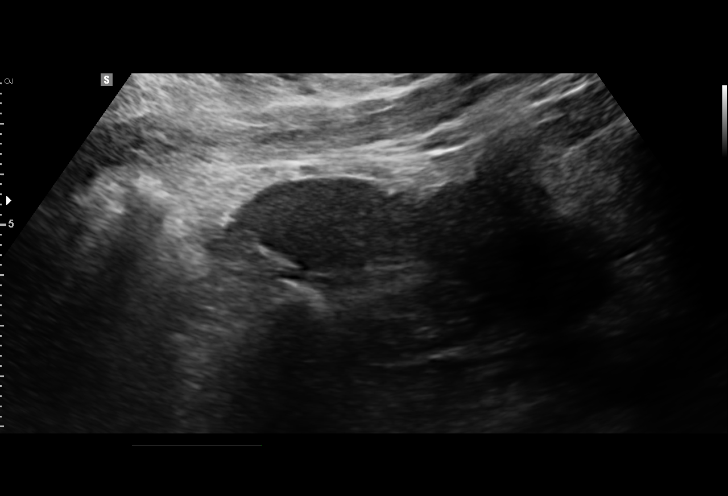
[im 20/44]
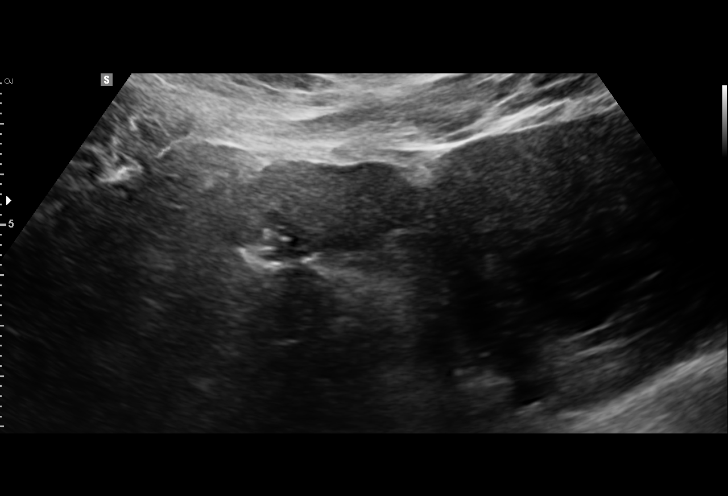
[im 23/44]
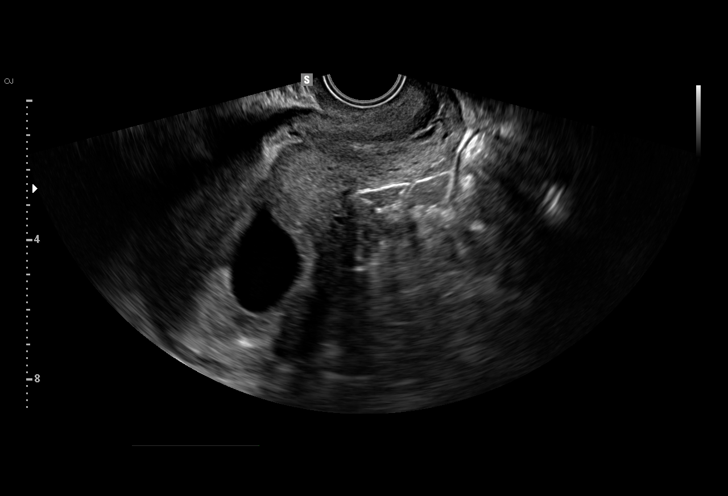
[im 24/44]
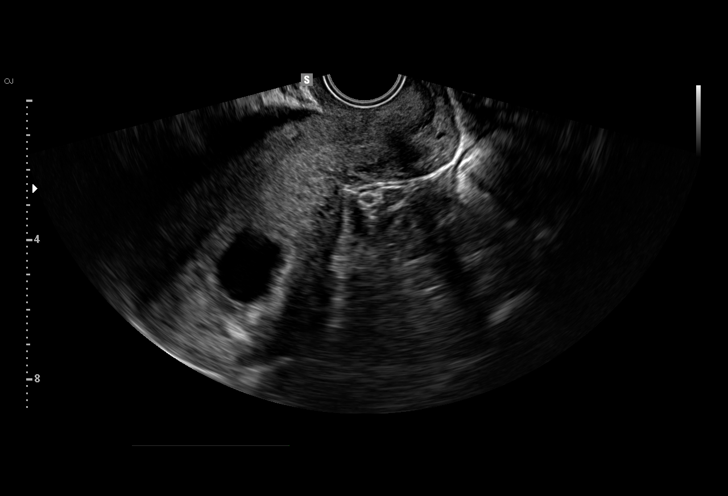
[im 28/44]
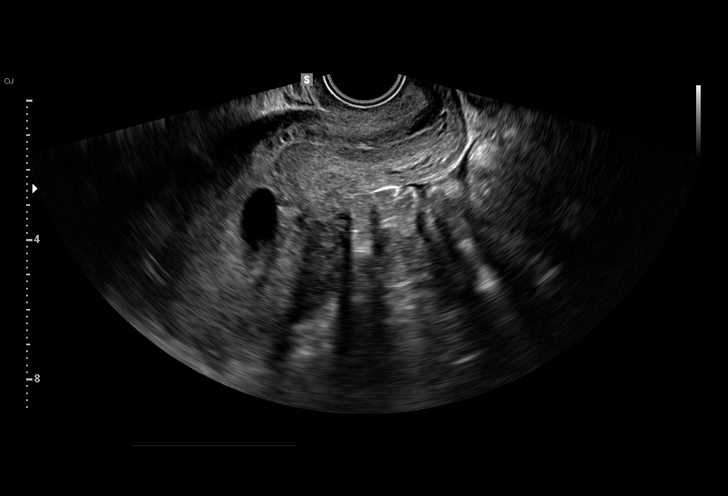
[im 31/44]
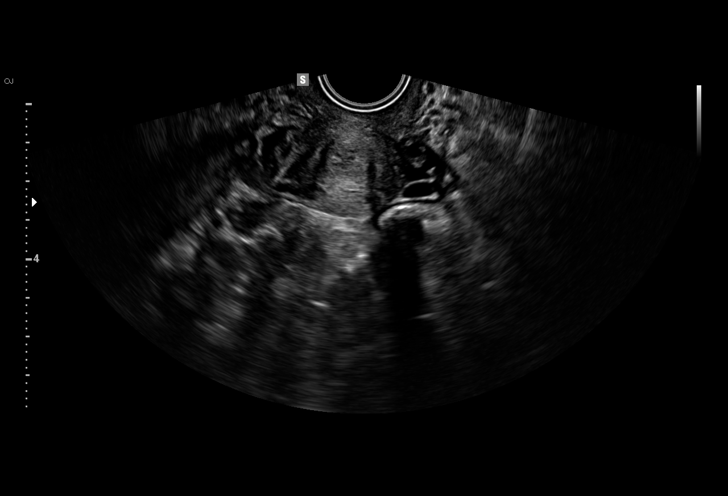
[im 34/44]
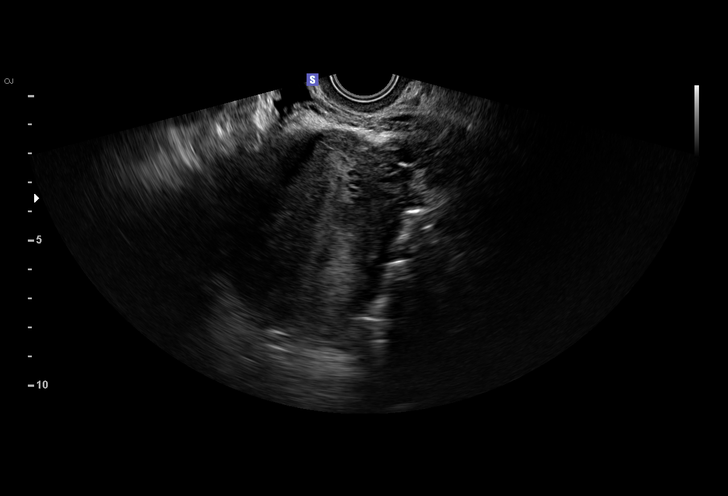
[im 37/44]
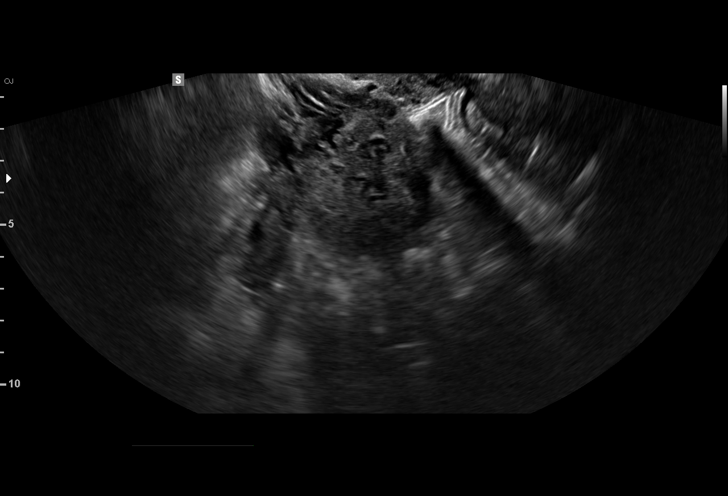
[im 40/44]
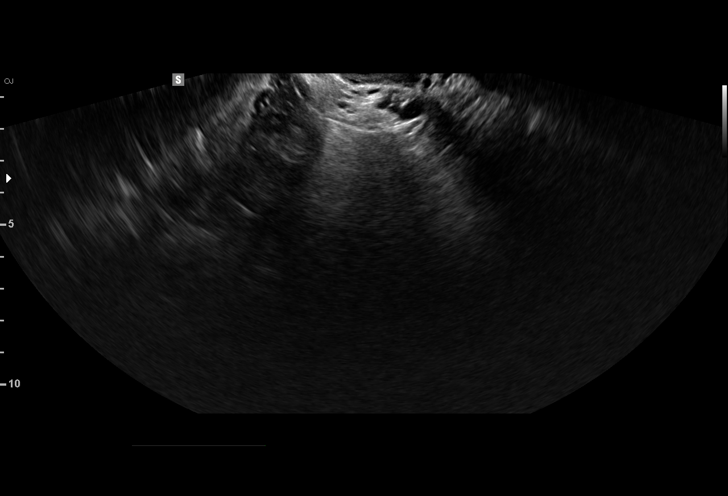
[im 44/44]
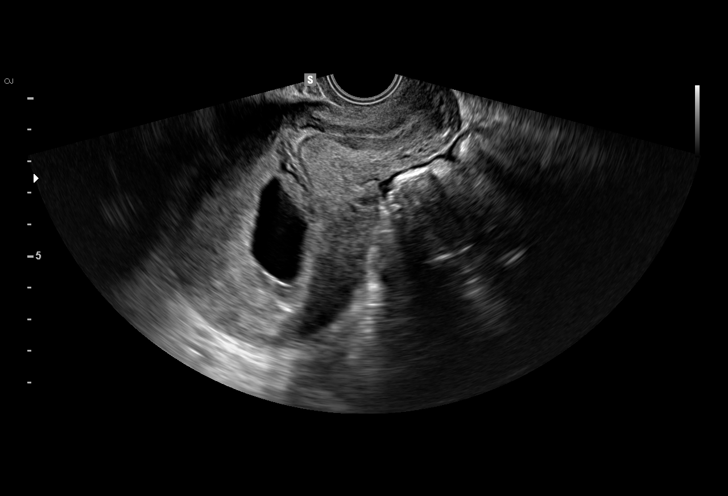

[15 of 28 positions shown; findings below may reference images not displayed]

FINDINGS: Intrauterine gestational sac: Present

Yolk sac:  Absent

Embryo:  Absent

MSD: 25.1  mm   7 w   4  d

Subchorionic hemorrhage:  None visualized.

Maternal uterus/adnexae: Within normal limits. No free fluid is
noted.
IMPRESSION: Intrauterine gestational sac without evidence of fetal pole.
Gestational sac size at 25 mm is on the borderline of a nonviable
pregnancy. Correlation with beta HCG levels is recommended. Followup
exam can be performed as clinically indicated.

## 2017-04-10 ENCOUNTER — Ambulatory Visit: Payer: Self-pay | Admitting: Physician Assistant

## 2017-07-24 ENCOUNTER — Ambulatory Visit: Payer: Self-pay | Admitting: Physician Assistant

## 2017-11-15 ENCOUNTER — Other Ambulatory Visit: Payer: Self-pay | Admitting: Adult Health

## 2017-11-15 DIAGNOSIS — R5383 Other fatigue: Secondary | ICD-10-CM

## 2017-12-26 ENCOUNTER — Other Ambulatory Visit: Payer: Self-pay | Admitting: Physician Assistant

## 2018-03-15 NOTE — Progress Notes (Signed)
Assessment and Plan:   Postpartum hypertension - continue medications, DASH diet, exercise and monitor at home. Call if greater than 130/80.  -     CBC with Differential/Platelet -     COMPLETE METABOLIC PANEL WITH GFR -     TSH  Morbidly obese ([place]) - follow up 4-6 weeks for progress monitoring - increase veggies, decrease carbs - long discussion about weight loss, diet, and exercise -     phentermine (ADIPEX-P) 37.5 MG tablet; Take 1 tablet (37.5 mg total) by mouth daily before breakfast. - will bring in food log  Gastroesophageal reflux disease with esophagitis Continue PPI/H2 blocker, diet discussed  Vitamin D deficiency GET ON VITAMIN D TO HELP WITH WEIGHT LOSS  Medication management -     CBC with Differential/Platelet -     COMPLETE METABOLIC PANEL WITH GFR  Mixed hyperlipidemia -     Lipid panel     HPI 32 y.o.female presents for 3 month follow up for morbid obesity, HTN.  Her blood pressure has been controlled at home, today their BP is BP: 112/80  She does not workout. She denies chest pain, shortness of breath, dizziness.  BMI is Body mass index is 40.23 kg/m., she is working on diet and exercise. She is off sodas, gets at least 60 oz of water a day and is trying to eat better.  Wt Readings from Last 3 Encounters:  03/19/18 206 lb (93.4 kg)  10/17/16 198 lb 12.8 oz (90.2 kg)  09/12/16 198 lb 6.4 oz (90 kg)     She is not on cholesterol medication and denies myalgias. Her cholesterol is at goal. The cholesterol last visit was:   Lab Results  Component Value Date   CHOL 131 09/12/2016   HDL 73 09/12/2016   LDLCALC 38 09/12/2016   TRIG 98 09/12/2016   CHOLHDL 1.8 09/12/2016   Last A1C in the office was:  Lab Results  Component Value Date   HGBA1C 5.0 09/12/2016   Patient is NOT on Vitamin D supplement.   Lab Results  Component Value Date   VD25OH 22 (L) 09/12/2016      Blood pressure 112/80, pulse 99, temperature 97.7 F (36.5 C), resp.  rate 18, height 5' (1.524 m), weight 206 lb (93.4 kg), last menstrual period 02/26/2018, SpO2 98 %, unknown if currently breastfeeding.    Past Medical History:  Diagnosis Date  . Abnormal Pap smear 2013   colposcopy  . Anemia   . Genital herpes 03/2012  . GERD (gastroesophageal reflux disease)   . Hypertension    during pregnancy and for several months after  . Migraines   . Previous cesarean section      No Known Allergies  Current Outpatient Medications on File Prior to Visit  Medication Sig  . buPROPion (WELLBUTRIN XL) 150 MG 24 hr tablet TAKE 1 TABLET BY MOUTH EVERY MORNING  . ibuprofen (ADVIL,MOTRIN) 200 MG tablet Take 200 mg by mouth 3 (three) times daily.  . pantoprazole (PROTONIX) 40 MG tablet TAKE 1 TABLET BY MOUTH EVERY DAY FOR 2 WEEKS   Current Facility-Administered Medications on File Prior to Visit  Medication  . dexamethasone (DECADRON) injection 10 mg    ROS: all negative except above.   Physical Exam: Filed Weights   03/19/18 0948  Weight: 206 lb (93.4 kg)   BP 112/80   Pulse 99   Temp 97.7 F (36.5 C)   Resp 18   Ht 5' (1.524 m)   [facility]  206 lb (93.4 kg)   LMP 02/26/2018   SpO2 98%   BMI 40.23 kg/m  General Appearance: Well nourished, in no apparent distress. Eyes: PERRLA, EOMs, conjunctiva no swelling or erythema Sinuses: No Frontal/maxillary tenderness ENT/Mouth: Ext aud canals clear, TMs without erythema, bulging. No erythema, swelling, or exudate on post pharynx.  Tonsils not swollen or erythematous. Hearing normal.  Neck: Supple, thyroid normal.  Respiratory: Respiratory effort normal, BS equal bilaterally without rales, rhonchi, wheezing or stridor.  Cardio: RRR with no MRGs. Brisk peripheral pulses without edema.  Abdomen: Soft, + BS.  Non tender, no guarding, rebound, hernias, masses. Lymphatics: Non tender without lymphadenopathy.  Musculoskeletal: Full ROM, 5/5 strength, normal gait.  Skin: Warm, dry without rashes, lesions,  ecchymosis.  Neuro: Cranial nerves intact. Normal muscle tone, no cerebellar symptoms. Sensation intact.  Psych: Awake and oriented X 3, normal affect, Insight and Judgment appropriate.     Vicie Mutters, PA-C 9:54 AM [facility]

## 2018-03-19 ENCOUNTER — Ambulatory Visit: Payer: Self-pay | Admitting: Physician Assistant

## 2018-03-19 ENCOUNTER — Ambulatory Visit: Payer: BLUE CROSS/BLUE SHIELD | Admitting: Physician Assistant

## 2018-03-19 ENCOUNTER — Encounter: Payer: Self-pay | Admitting: Physician Assistant

## 2018-03-19 VITALS — BP 112/80 | HR 99 | Temp 97.7°F | Resp 18 | Ht 60.0 in | Wt 206.0 lb

## 2018-03-19 DIAGNOSIS — K2100 Gastro-esophageal reflux disease with esophagitis, without bleeding: Secondary | ICD-10-CM

## 2018-03-19 DIAGNOSIS — E782 Mixed hyperlipidemia: Secondary | ICD-10-CM

## 2018-03-19 DIAGNOSIS — O165 Unspecified maternal hypertension, complicating the puerperium: Secondary | ICD-10-CM

## 2018-03-19 DIAGNOSIS — Z79899 Other long term (current) drug therapy: Secondary | ICD-10-CM

## 2018-03-19 DIAGNOSIS — E559 Vitamin D deficiency, unspecified: Secondary | ICD-10-CM | POA: Diagnosis not present

## 2018-03-19 MED ORDER — PHENTERMINE HCL 37.5 MG PO TABS: 37.5000 mg | ORAL_TABLET | Freq: Every day | ORAL | 0 refills | Status: DC

## 2018-03-19 NOTE — Patient Instructions (Addendum)
Check out  Mini habits for weight loss book  2 apps for tracking food is myfitness pal  loseit OR can take picture of your food  I want you to grade your hunger on a scale of 1-10 before you eat  Look up mindful eating  Being dehydrated can hurt your kidneys, cause fatigue, headaches, muscle aches, joint pain, and dry skin/nails so please increase your fluids.   Drink 80-100 oz a day of water, measure it out! Eat 3 meals a day, have to do breakfast, eat protein- hard boiled eggs, protein bar like [facility] protein bar, greek yogurt like oikos triple zero, chobani 100, or light n fit greek  Can check out plantnanny app on your phone to help you keep track of your water     Intermittent fasting is more about strategy than starvation. It's meant to reset your body in different ways, hopefully with fitness and nutrition changes as a result.  Like any big switchover, though, results may vary when it comes down to the individual level. What works for your friends may not work for you, or vice versa. That's why it's helpful to play around with variations on intermittent fasting and healthy habits and find what works best for you.  WHAT IS INTERMITTENT FASTING AND WHY DO IT?  Intermittent fasting doesn't involve specific foods, but rather, a strict schedule regarding when you eat. Also called "time-restricted eating," the tactic has been praised for its contribution to weight loss, improved body composition, and decreased cravings. Preliminary research also suggests it may be beneficial for glucose tolerance, hormone regulation, better muscle mass and lower body fat.  Part of its appeal is the simplicity of the effort. Unlike some other trends, there's no calculations to intermittent fasting.  You simply eat within a certain block of time, usually a window of 8-10 hours. In the other big block of time - about 14-16 hours, including when you're asleep - you don't eat anything, not even  snacks. You can drink water, coffee, tea or any other beverage that doesn't have calories.  For example, if you like having a late dinner, you might skip breakfast and have your first meal at noon and your last meal of the day at 8 p.m., and then not eat until noon again the next day.  IDEAS FOR GETTING STARTED  If you're new to the strategy, it may be helpful to eat within the typical circadian rhythm and keep eating within daylight hours. This can be especially beneficial if you're looking at intermittent fasting for weight-loss goals.  So first try only eating between 12pm to 8pm.  Outside of this time you may have water, black coffee, and hot tea. You may not eat it drink anything that has carbs, sugars, OR artificial sugars like diet soda.   Like any major eating and fitness shift, it can take time to find the perfect fit, so don't be afraid to experiment with different options - including ditching intermittent fasting altogether if it's simply not for you. But if it is, you may be surprised by some of the benefits that come along with the strategy.  Veggies are great because you can eat a ton! They are low in calories, great to fill you up, and have a ton of vitamins, minerals, and protein.       Phentermine  While taking the medication we may ask that you come into the office once a month for the first month for a blood pressure  check and EKG.   Also please bring in a food log for that visit to review. It is helpful if you bring in a food diary or use an app on your phone such as myfitnesspal to record your calorie intake, especially in the beginning. BRING FOR YOUR FIRST VISIT.  After that first initial visit, we will want to see you once every 2-3 months to monitor your weight, blood pressure, and heart rate.   In addition we can help answer your questions about diet, exercise, and help you every step of the way with your weight loss journey.  You can start out on 1/3 to 1/2 a  pill in the morning and if you are tolerating it well you can increase to one pill daily. I also have some patients that take 1/3 or 1/2 at lunch to help prevent night time eating.  This medication is cheapest CASH pay at [facility] is 14-17 dollars and you do NOT need a membership to get meds from there.   It causes dry mouth and constipation in almost every patient, so try to get 80-100 oz of water a day and increase fiber such as veggies. You can add on a stool softener if you would like.   It can give you energy however it can also cause some people to be shaky, anxious or have palpitations. Stop this medication if that happens and contact the office.   If this medication does not work for you there are several medications that we can try to help rewire your brain in addition to making healthier habits.   What is this medicine? PHENTERMINE (FEN ter meen) decreases your appetite. This medicine is intended to be used in addition to a healthy reduced calorie diet and exercise. The best results are achieved this way. This medicine is only indicated for short-term use. Eventually your weight loss may level out and the medication will no longer be needed.   How should I use this medicine? Take this medicine by mouth. Follow the directions on the prescription label. The tablets should stay in the bottle until immediately before you take your dose. Take your doses at regular intervals. Do not take your medicine more often than directed.  Overdosage: If you think you have taken too much of this medicine contact a poison control center or emergency room at once. NOTE: This medicine is only for you. Do not share this medicine with others.  What if I miss a dose? If you miss a dose, take it as soon as you can. If it is almost time for your next dose, take only that dose. Do not take double or extra doses. Do not increase or in any way change your dose without consulting your  doctor.  What should I watch for while using this medicine? Notify your physician immediately if you become short of breath while doing your normal activities. Do not take this medicine within 6 hours of bedtime. It can keep you from getting to sleep. Avoid drinks that contain caffeine and try to stick to a regular bedtime every night. Do not stand or sit up quickly, especially if you are an older patient. This reduces the risk of dizzy or fainting spells. Avoid alcoholic drinks.  What side effects may I notice from receiving this medicine? Side effects that you should report to your doctor or health care professional as soon as possible: -chest pain, palpitations -depression or severe changes in mood -increased blood  pressure -irritability -nervousness or restlessness -severe dizziness -shortness of breath -problems urinating -unusual swelling of the legs -vomiting  Side effects that usually do not require medical attention (report to your doctor or health care professional if they continue or are bothersome): -blurred vision or other eye problems -changes in sexual ability or desire -constipation or diarrhea -difficulty sleeping -dry mouth or unpleasant taste -headache -nausea This list may not describe all possible side effects. Call your doctor for medical advice about side effects. You may report side effects to FDA at 1-800-FDA-1088.   Vitamin D goal is between 60-80  Please make sure that you are taking your Vitamin D as directed.   It is very important as a natural anti-inflammatory   helping hair, skin, and nails, as well as reducing stroke and heart attack risk.   It helps your bones and helps with mood.  We want you on at least 5000 IU daily  It also decreases numerous cancer risks so please take it as directed.   Low Vit D is associated with a 200-300% higher risk for CANCER   and 200-300% higher risk for HEART   ATTACK  &  STROKE.     .....................................Marland Kitchen  It is also associated with higher death rate at younger ages,   autoimmune diseases like Rheumatoid arthritis, Lupus, Multiple Sclerosis.     Also many other serious conditions, like depression, Alzheimer's  Dementia, infertility, muscle aches, fatigue, fibromyalgia - just to name a few.  +++++++++++++++++++  Can get liquid vitamin D from [place] here in [place] at  [facility] alternatives [address] [place], [place] [address] Or you can try earth fare

## 2018-03-20 LAB — CBC WITH DIFFERENTIAL/PLATELET
BASOS PCT: 0.4 %
Basophils Absolute: 30 cells/uL (ref 0–200)
EOS ABS: 68 {cells}/uL (ref 15–500)
Eosinophils Relative: 0.9 %
HCT: 39.6 % (ref 35.0–45.0)
Hemoglobin: 12.2 g/dL (ref 11.7–15.5)
Lymphs Abs: 3032 cells/uL (ref 850–3900)
MCH: 22.5 pg — AB (ref 27.0–33.0)
MCHC: 30.8 g/dL — ABNORMAL LOW (ref 32.0–36.0)
MCV: 73.1 fL — AB (ref 80.0–100.0)
MPV: 11.2 fL (ref 7.5–12.5)
Monocytes Relative: 8.3 %
NEUTROS PCT: 50.5 %
Neutro Abs: 3838 cells/uL (ref 1500–7800)
PLATELETS: 266 10*3/uL (ref 140–400)
RBC: 5.42 10*6/uL — ABNORMAL HIGH (ref 3.80–5.10)
RDW: 13.4 % (ref 11.0–15.0)
TOTAL LYMPHOCYTE: 39.9 %
WBC: 7.6 10*3/uL (ref 3.8–10.8)
WBCMIX: 631 {cells}/uL (ref 200–950)

## 2018-03-20 LAB — COMPLETE METABOLIC PANEL WITH GFR
AG RATIO: 1.5 (calc) (ref 1.0–2.5)
ALT: 22 U/L (ref 6–29)
AST: 20 U/L (ref 10–30)
Albumin: 4.1 g/dL (ref 3.6–5.1)
Alkaline phosphatase (APISO): 60 U/L (ref 33–115)
BUN: 14 mg/dL (ref 7–25)
CALCIUM: 9.5 mg/dL (ref 8.6–10.2)
CO2: 29 mmol/L (ref 20–32)
Chloride: 106 mmol/L (ref 98–110)
Creat: 0.88 mg/dL (ref 0.50–1.10)
GFR, EST NON AFRICAN AMERICAN: 88 mL/min/{1.73_m2} (ref >=60)
GFR, Est African American: 101 mL/min/{1.73_m2} (ref >=60)
GLOBULIN: 2.8 g/dL (ref 1.9–3.7)
Glucose, Bld: 93 mg/dL (ref 65–99)
POTASSIUM: 4.7 mmol/L (ref 3.5–5.3)
SODIUM: 140 mmol/L (ref 135–146)
Total Bilirubin: 0.4 mg/dL (ref 0.2–1.2)
Total Protein: 6.9 g/dL (ref 6.1–8.1)

## 2018-03-20 LAB — TSH: TSH: 1.23 mIU/L

## 2018-03-20 LAB — LIPID PANEL
CHOL/HDL RATIO: 2.2 (calc) (ref <5.0)
CHOLESTEROL: 135 mg/dL (ref <200)
HDL: 61 mg/dL (ref >50)
LDL Cholesterol (Calc): 55 mg/dL (calc)
NON-HDL CHOLESTEROL (CALC): 74 mg/dL (ref <130)
Triglycerides: 100 mg/dL (ref <150)

## 2018-03-22 ENCOUNTER — Other Ambulatory Visit: Payer: Self-pay | Admitting: Physician Assistant

## 2018-03-22 DIAGNOSIS — K2100 Gastro-esophageal reflux disease with esophagitis, without bleeding: Secondary | ICD-10-CM

## 2018-04-15 NOTE — Progress Notes (Deleted)
32 y.o.female presents for a follow up after being on phentermine for weight loss.  While on the medication they have lost {NUMBERS 0-12:18577} lbs since last visit. They deny palpitations, anxiety, trouble sleeping, elevated BP.   BP Readings from Last 3 Encounters:  03/19/18 112/80  10/17/16 118/76  09/12/16 122/80    An EKG was obtained at today's visit.   BMI is There is no height or weight on file to calculate BMI., she is working on diet and exercise. Wt Readings from Last 3 Encounters:  03/19/18 206 lb (93.4 kg)  10/17/16 198 lb 12.8 oz (90.2 kg)  09/12/16 198 lb 6.4 oz (90 kg)    Typical breakfast: Typical lunch:  Typical dinner:  Medications: Current Outpatient Medications on File Prior to Visit  Medication Sig Dispense Refill  . buPROPion (WELLBUTRIN XL) 150 MG 24 hr tablet TAKE 1 TABLET BY MOUTH EVERY MORNING 90 tablet 0  . ibuprofen (ADVIL,MOTRIN) 200 MG tablet Take 200 mg by mouth 3 (three) times daily.    . pantoprazole (PROTONIX) 40 MG tablet TAKE 1 TABLET BY MOUTH EVERY DAY FOR 2 WEEKS 30 tablet 4  . phentermine (ADIPEX-P) 37.5 MG tablet Take 1 tablet (37.5 mg total) by mouth daily before breakfast. 30 tablet 0   Current Facility-Administered Medications on File Prior to Visit  Medication Dose Route Frequency Provider Last Rate Last Dose  . dexamethasone (DECADRON) injection 10 mg  10 mg Intramuscular Once Vicie Mutters, PA-C        ROS: All negative except for above  Physical exam: There were no vitals filed for this visit. Physical Exam  Assessment: Obesity with co morbid conditions.   Plan: General weight loss/lifestyle modification strategies discussed (elicit support from others; identify saboteurs; non-food rewards, etc). Continue food diary Continue restricted calorie diet Continue daily exercise as well as behavior modification such as walking further away, putting down the fork, having a plan, using stairs, etc.  Medication: phentermine   Follow up in {NUMBERS;1-7 BY 1:408017} months Future Appointments  Date Time Provider [place]  04/17/2018  9:30 AM Vicie Mutters, PA-C [facility] None  07/05/2018  9:00 AM Vicie Mutters, PA-C [facility] None

## 2018-04-17 ENCOUNTER — Ambulatory Visit: Payer: Self-pay | Admitting: Physician Assistant

## 2018-04-18 ENCOUNTER — Encounter: Payer: Self-pay | Admitting: Physician Assistant

## 2018-04-18 NOTE — Progress Notes (Signed)
32 y.o.female presents for a follow up after being on phentermine for weight loss.  While on the medication they have lost 4 lbs since last visit. She has had dry mouth and states she has no energy with the medication but is tolerating it well otherwise. They deny palpitations, anxiety, trouble sleeping, elevated BP. She did not bring a food log.   She complains of snoring at night, waking up frequently, fatigue in the morning and fatigue throughout the day, she has a crowded mouth on exam.    BP Readings from Last 3 Encounters:  04/19/18 118/84  03/19/18 112/80  10/17/16 118/76   BMI is Body mass index is 39.49 kg/m., she is working on diet and exercise. Wt Readings from Last 3 Encounters:  04/19/18 202 lb 3.2 oz (91.7 kg)  03/19/18 206 lb (93.4 kg)  10/17/16 198 lb 12.8 oz (90.2 kg)    has Postpartum hypertension; Genital HSV; Morbidly obese ([place]); Gastroesophageal reflux disease with esophagitis; and Vitamin D deficiency on their problem list.  Medications: Current Outpatient Medications on File Prior to Visit  Medication Sig Dispense Refill  . buPROPion (WELLBUTRIN XL) 150 MG 24 hr tablet TAKE 1 TABLET BY MOUTH EVERY MORNING 90 tablet 0  . ibuprofen (ADVIL,MOTRIN) 200 MG tablet Take 200 mg by mouth 3 (three) times daily.    . pantoprazole (PROTONIX) 40 MG tablet TAKE 1 TABLET BY MOUTH EVERY DAY FOR 2 WEEKS 30 tablet 4  . phentermine (ADIPEX-P) 37.5 MG tablet Take 1 tablet (37.5 mg total) by mouth daily before breakfast. 30 tablet 0   Current Facility-Administered Medications on File Prior to Visit  Medication Dose Route Frequency Provider Last Rate Last Dose  . dexamethasone (DECADRON) injection 10 mg  10 mg Intramuscular Once Vicie Mutters, PA-C        ROS: All negative except for above  Physical exam: Vitals:   04/19/18 0938  BP: 118/84  Pulse: 77  Resp: 16  Temp: (!) 97.4 F (36.3 C)  SpO2: 99%   Physical Exam  Constitutional: She is oriented to person, place,  and time. She appears well-developed and well-nourished.  HENT:  Head: Normocephalic and atraumatic.  Right Ear: External ear normal.  Left Ear: External ear normal.  Mouth/Throat: Oropharynx is clear and moist.  Crowded mouth  Eyes: Pupils are equal, round, and reactive to light. Conjunctivae and EOM are normal.  Neck: Normal range of motion. Neck supple. No thyromegaly present.  Cardiovascular: Normal rate, regular rhythm and normal heart sounds. Exam reveals no gallop and no friction rub.  No murmur heard. Pulmonary/Chest: Effort normal and breath sounds normal. No respiratory distress. She has no wheezes.  Abdominal: Soft. Bowel sounds are normal. She exhibits no distension and no mass. There is no tenderness. There is no rebound and no guarding.  Musculoskeletal: Normal range of motion.  Lymphadenopathy:    She has no cervical adenopathy.  Neurological: She is alert and oriented to person, place, and time. She displays normal reflexes. No cranial nerve deficit. Coordination normal.  Skin: Skin is warm and dry.  Psychiatric: She has a normal mood and affect.    Assessment: Obesity with co morbid conditions.  Sleep apnea  Plan: General weight loss/lifestyle modification strategies discussed (elicit support from others; identify saboteurs; non-food rewards, etc). Continue food diary Continue restricted calorie diet Continue daily exercise as well as behavior modification such as walking further away, putting down the fork, having a plan, using stairs, etc.  Medication: phentermine  Patient has  crowded mouth and several symptoms of sleep apnea, will send a referral.   Follow up in 1 month Future Appointments  Date Time Provider [place]  07/05/2018  9:00 AM Vicie Mutters, PA-C [facility] None

## 2018-04-19 ENCOUNTER — Encounter: Payer: Self-pay | Admitting: Physician Assistant

## 2018-04-19 ENCOUNTER — Ambulatory Visit: Payer: BLUE CROSS/BLUE SHIELD | Admitting: Physician Assistant

## 2018-04-19 DIAGNOSIS — A6000 Herpesviral infection of urogenital system, unspecified: Secondary | ICD-10-CM

## 2018-04-19 DIAGNOSIS — O165 Unspecified maternal hypertension, complicating the puerperium: Secondary | ICD-10-CM

## 2018-04-19 DIAGNOSIS — K2100 Gastro-esophageal reflux disease with esophagitis, without bleeding: Secondary | ICD-10-CM

## 2018-04-19 DIAGNOSIS — G4733 Obstructive sleep apnea (adult) (pediatric): Secondary | ICD-10-CM

## 2018-04-19 DIAGNOSIS — E559 Vitamin D deficiency, unspecified: Secondary | ICD-10-CM

## 2018-04-19 MED ORDER — PHENTERMINE HCL 37.5 MG PO TABS: 37.5000 mg | ORAL_TABLET | Freq: Every day | ORAL | 1 refills | Status: AC

## 2018-04-19 NOTE — Patient Instructions (Signed)
Stop the sugary coffee's- will save you 2000 calories a day   Google mindful eating and here are some tips and tricks below.   Rate your hunger before you eat on a scale of 1-10, try to eat closer to a 6 or higher. And if you are at below that, why are you eating? Slow down and listen to your body.

## 2018-04-27 ENCOUNTER — Other Ambulatory Visit: Payer: Self-pay | Admitting: Adult Health

## 2018-05-11 ENCOUNTER — Other Ambulatory Visit: Payer: Self-pay | Admitting: Adult Health

## 2018-05-11 DIAGNOSIS — R5383 Other fatigue: Secondary | ICD-10-CM

## 2018-05-21 NOTE — Progress Notes (Deleted)
32 y.o.female presents for a follow up after being on phentermine for weight loss.  While on the medication they have lost 4 lbs since last visit. She has had dry mouth and states she has no energy with the medication but is tolerating it well otherwise. They deny palpitations, anxiety, trouble sleeping, elevated BP. She did not bring a food log.   She complains of snoring at night, waking up frequently, fatigue in the morning and fatigue throughout the day, she has a crowded mouth on exam.    BP Readings from Last 3 Encounters:  04/19/18 118/84  03/19/18 112/80  10/17/16 118/76   BMI is There is no height or weight on file to calculate BMI., she is working on diet and exercise. Wt Readings from Last 3 Encounters:  04/19/18 202 lb 3.2 oz (91.7 kg)  03/19/18 206 lb (93.4 kg)  10/17/16 198 lb 12.8 oz (90.2 kg)    has Postpartum hypertension; Genital HSV; Morbidly obese ([place]); Gastroesophageal reflux disease with esophagitis; and Vitamin D deficiency on their problem list.  Medications: Current Outpatient Medications on File Prior to Visit  Medication Sig Dispense Refill  . buPROPion (WELLBUTRIN XL) 150 MG 24 hr tablet TAKE 1 TABLET BY MOUTH EVERY MORNING 90 tablet 1  . ibuprofen (ADVIL,MOTRIN) 200 MG tablet Take 200 mg by mouth 3 (three) times daily.    Marland Kitchen NUVARING 0.12-0.015 MG/24HR vaginal ring insert 1 ring vaginally every 28 DAYS 3 each 0  . pantoprazole (PROTONIX) 40 MG tablet TAKE 1 TABLET BY MOUTH EVERY DAY FOR 2 WEEKS 30 tablet 4  . phentermine (ADIPEX-P) 37.5 MG tablet Take 1 tablet (37.5 mg total) by mouth daily before breakfast. 30 tablet 1   Current Facility-Administered Medications on File Prior to Visit  Medication Dose Route Frequency Provider Last Rate Last Dose  . dexamethasone (DECADRON) injection 10 mg  10 mg Intramuscular Once Vicie Mutters, PA-C        ROS: All negative except for above  Physical exam: There were no vitals filed for this visit. Physical Exam   Constitutional: She is oriented to person, place, and time. She appears well-developed and well-nourished.  HENT:  Head: Normocephalic and atraumatic.  Right Ear: External ear normal.  Left Ear: External ear normal.  Mouth/Throat: Oropharynx is clear and moist.  Crowded mouth  Eyes: Pupils are equal, round, and reactive to light. Conjunctivae and EOM are normal.  Neck: Normal range of motion. Neck supple. No thyromegaly present.  Cardiovascular: Normal rate, regular rhythm and normal heart sounds. Exam reveals no gallop and no friction rub.  No murmur heard. Pulmonary/Chest: Effort normal and breath sounds normal. No respiratory distress. She has no wheezes.  Abdominal: Soft. Bowel sounds are normal. She exhibits no distension and no mass. There is no tenderness. There is no rebound and no guarding.  Musculoskeletal: Normal range of motion.  Lymphadenopathy:    She has no cervical adenopathy.  Neurological: She is alert and oriented to person, place, and time. She displays normal reflexes. No cranial nerve deficit. Coordination normal.  Skin: Skin is warm and dry.  Psychiatric: She has a normal mood and affect.    Assessment: Obesity with co morbid conditions.  Sleep apnea  Plan: General weight loss/lifestyle modification strategies discussed (elicit support from others; identify saboteurs; non-food rewards, etc). Continue food diary Continue restricted calorie diet Continue daily exercise as well as behavior modification such as walking further away, putting down the fork, having a plan, using stairs, etc.  Medication:  phentermine  Patient has crowded mouth and several symptoms of sleep apnea, will send a referral.   Follow up in 1 month Future Appointments  Date Time Provider [place]  05/22/2018 10:30 AM Vicie Mutters, PA-C [facility] None  06/25/2018 10:00 AM Dohmeier, Asencion Partridge, MD [facility] None  07/05/2018  9:00 AM Vicie Mutters, PA-C [facility] None

## 2018-05-22 ENCOUNTER — Ambulatory Visit: Payer: Self-pay | Admitting: Physician Assistant

## 2018-06-21 ENCOUNTER — Institutional Professional Consult (permissible substitution): Payer: Self-pay | Admitting: Neurology

## 2018-06-22 ENCOUNTER — Ambulatory Visit: Payer: Self-pay | Admitting: Physician Assistant

## 2018-06-25 ENCOUNTER — Institutional Professional Consult (permissible substitution): Payer: Self-pay | Admitting: Neurology

## 2018-07-02 NOTE — Progress Notes (Deleted)
Complete Physical  Assessment and Plan:  Routine general medical examination at a health care facility  Screening for diabetes mellitus -     Hemoglobin A1c -     Insulin, fasting  Screening cholesterol level -     Lipid panel  Medication management -     CBC with Differential/Platelet -     BASIC METABOLIC PANEL WITH GFR -     Hepatic function panel -     Magnesium  Vitamin D deficiency -     VITAMIN D 25 Hydroxy (Vit-D Deficiency, Fractures)  Anemia, unspecified type -     Iron and TIBC -     Ferritin -     Vitamin B12  Other fatigue Fatigue - Discussed lifestyle modification as means of resolving problem, Training modifications discussed, See orders for lab evaluation, Discussed how depression can be a cause of fatigue, will try wellbutrin -     TSH -     buPROPion (WELLBUTRIN XL) 150 MG 24 hr tablet; Take 1 tablet (150 mg total) by mouth every morning.  Screening for hematuria or proteinuria -     Urinalysis, Routine w reflex microscopic -     Microalbumin / creatinine urine ratio  Gastroesophageal reflux disease with esophagitis -     pantoprazole (PROTONIX) 40 MG tablet; Take 1 pill daily for 2 weeks - stop aleve/ibuprofen, do protonix x 2 weeks, diet discussed.   Morbid Obesity with co morbidities - long discussion about weight loss, diet, and exercise  Discussed med's effects and SE's. Screening labs and tests as requested with regular follow-up as recommended. Over 40 minutes of exam, counseling, chart review, and complex, high level critical decision making was performed this visit.   HPI  33 y.o. female  presents for a complete physical and follow up for has Postpartum hypertension; Genital HSV; Morbidly obese ([place]); Gastroesophageal reflux disease with esophagitis; and Vitamin D deficiency on their problem list..  Her blood pressure has been controlled at home, today their BP is   She does not workout. She denies chest pain, shortness of breath,  dizziness.  Single mom, jaylen and Darlyn Chamber, works at [employer], Occupational psychologist.   Suppose to be sent for sleep study and has been on phentermine x 3 months.  BMI is There is no height or weight on file to calculate BMI., she is working on diet and exercise. Wt Readings from Last 3 Encounters:  04/19/18 202 lb 3.2 oz (91.7 kg)  03/19/18 206 lb (93.4 kg)  10/17/16 198 lb 12.8 oz (90.2 kg)    Has GERD, has gotten up to zantac 150 x 2 days. Will take ibuprofen 2-3 x a week.   The cholesterol last visit was:   Lab Results  Component Value Date   CHOL 135 03/19/2018   HDL 61 03/19/2018   LDLCALC 55 03/19/2018   TRIG 100 03/19/2018   CHOLHDL 2.2 03/19/2018   Last A1C in the office was:  Lab Results  Component Value Date   HGBA1C 5.0 09/12/2016   Patient is on Vitamin D supplement.   Lab Results  Component Value Date   VD25OH 22 (L) 09/12/2016       Current Medications:  Current Outpatient Medications on File Prior to Visit  Medication Sig Dispense Refill  . buPROPion (WELLBUTRIN XL) 150 MG 24 hr tablet TAKE 1 TABLET BY MOUTH EVERY MORNING 90 tablet 1  . ibuprofen (ADVIL,MOTRIN) 200 MG tablet Take 200 mg by mouth 3 (three) times daily.    Marland Kitchen  NUVARING 0.12-0.015 MG/24HR vaginal ring insert 1 ring vaginally every 28 DAYS 3 each 0  . pantoprazole (PROTONIX) 40 MG tablet TAKE 1 TABLET BY MOUTH EVERY DAY FOR 2 WEEKS 30 tablet 4  . phentermine (ADIPEX-P) 37.5 MG tablet Take 1 tablet (37.5 mg total) by mouth daily before breakfast. 30 tablet 1   Current Facility-Administered Medications on File Prior to Visit  Medication Dose Route Frequency Provider Last Rate Last Dose  . dexamethasone (DECADRON) injection 10 mg  10 mg Intramuscular Once Vicie Mutters, PA-C       Allergies:  No Known Allergies Medical History:  She has Postpartum hypertension; Genital HSV; Morbidly obese ([place]); Gastroesophageal reflux disease with esophagitis; and Vitamin D deficiency on their problem  list. Health Maintenance:   There is no immunization history for the selected administration types on file for this patient.  Tetanus: 2014 Pneumovax: N/A Prevnar 13: N/A Flu vaccine: declines Zostavax:N/A  No LMP recorded. Pap: q 2 years, follows with Dr. Charlesetta Garibaldi at [facility]: N/A DEXA: N/A Colonoscopy:N/A EGD: N/A  Patient Care Team: Unk Pinto, MD as PCP - General (Internal Medicine)  Surgical History:  She has a past surgical history that includes Colposcopy (2013); Cesarean section (2008); and Cesarean section (N/A, 05/31/2013). Family History:  Herfamily history includes Arthritis in her maternal grandmother; Asthma in her sister; Depression in her mother; Heart disease in her maternal aunt and maternal grandfather; Hypertension in her mother; Migraines in her mother; Tuberculosis in her brother. Social History:  She reports that she has never smoked. She has never used smokeless tobacco. She reports that she does not drink alcohol or use drugs.  Review of Systems: Review of Systems  Constitutional: Positive for malaise/fatigue. Negative for chills, diaphoresis, fever and weight loss.  HENT: Negative.   Eyes: Negative.   Respiratory: Negative.   Cardiovascular: Negative.   Gastrointestinal: Positive for heartburn. Negative for abdominal pain, blood in stool, constipation, diarrhea, melena, nausea and vomiting.  Genitourinary: Negative.   Musculoskeletal: Negative.   Skin: Negative.   Neurological: Negative for weakness.    Physical Exam: Estimated body mass index is 39.49 kg/m as calculated from the following:   Height as of 04/19/18: 5' (1.524 m).   Weight as of 04/19/18: 202 lb 3.2 oz (91.7 kg). There were no vitals taken for this visit. General Appearance: Well nourished, in no apparent distress.  Eyes: PERRLA, EOMs, conjunctiva no swelling or erythema, normal fundi and vessels.  Sinuses: No Frontal/maxillary tenderness  ENT/Mouth: Ext aud  canals clear, normal light reflex with TMs without erythema, bulging. Good dentition. No erythema, swelling, or exudate on post pharynx. Tonsils not swollen or erythematous. Hearing normal.  Neck: Supple, thyroid normal. No bruits  Respiratory: Respiratory effort normal, BS equal bilaterally without rales, rhonchi, wheezing or stridor.  Cardio: RRR without murmurs, rubs or gallops. Brisk peripheral pulses without edema.  Chest: symmetric, with normal excursions and percussion.  Breasts: Symmetric, without lumps, nipple discharge, retractions.  Abdomen: Soft, nontender, no guarding, rebound, hernias, masses, or organomegaly.  Lymphatics: Non tender without lymphadenopathy.  Genitourinary:  Musculoskeletal: Full ROM all peripheral extremities,5/5 strength, and normal gait.  Skin: Warm, dry without rashes, lesions, ecchymosis. Neuro: Cranial nerves intact, reflexes equal bilaterally. Normal muscle tone, no cerebellar symptoms. Sensation intact.  Psych: Awake and oriented X 3, normal affect, Insight and Judgment appropriate.   EKG: WNL no ST changes. AORTA SCAN: WNL   Vicie Mutters 1:22 PM [facility]

## 2018-07-05 ENCOUNTER — Encounter: Payer: Self-pay | Admitting: Physician Assistant

## 2018-10-19 ENCOUNTER — Other Ambulatory Visit: Payer: Self-pay | Admitting: Adult Health

## 2018-10-19 DIAGNOSIS — K2100 Gastro-esophageal reflux disease with esophagitis, without bleeding: Secondary | ICD-10-CM

## 2019-02-28 ENCOUNTER — Other Ambulatory Visit: Payer: Self-pay

## 2019-02-28 DIAGNOSIS — Z20822 Contact with and (suspected) exposure to covid-19: Secondary | ICD-10-CM

## 2019-03-01 ENCOUNTER — Telehealth: Payer: Self-pay | Admitting: *Deleted

## 2019-03-01 ENCOUNTER — Other Ambulatory Visit: Payer: Self-pay

## 2019-03-01 DIAGNOSIS — Z20822 Contact with and (suspected) exposure to covid-19: Secondary | ICD-10-CM

## 2019-03-01 LAB — NOVEL CORONAVIRUS, NAA

## 2019-03-01 NOTE — Telephone Encounter (Signed)
PEC agent, Caryn H., received a call from [facility] this morning. [facility] stated they were needing to give names of 5 patients who have to be retested due to the an inconsistency between the names on samples and the names on requisitions. The names did not match therefore the patients need to be retested. Test was for COVID-19.  Pt called and left message to return call. Pt will need to be informed that she will need to be retested for COVID-19.

## 2019-03-01 NOTE — Telephone Encounter (Signed)
Pt called back and given information; she verbalized understanding and agrees to be retested.

## 2019-03-02 LAB — NOVEL CORONAVIRUS, NAA: SARS-CoV-2, NAA: NOT DETECTED

## 2019-03-09 ENCOUNTER — Other Ambulatory Visit: Payer: Self-pay | Admitting: Internal Medicine

## 2019-03-09 DIAGNOSIS — R5383 Other fatigue: Secondary | ICD-10-CM

## 2019-07-09 ENCOUNTER — Encounter: Payer: Self-pay | Admitting: Physician Assistant

## 2019-07-28 MED ORDER — BISACODYL 10 MG RE SUPP
10.00 | RECTAL | Status: DC
Start: ? — End: 2019-07-28

## 2019-07-28 MED ORDER — SALINE NASAL SPRAY 0.65 % NA SOLN
2.00 | NASAL | Status: DC
Start: ? — End: 2019-07-28

## 2019-07-28 MED ORDER — IBUPROFEN 400 MG PO TABS
800.00 | ORAL_TABLET | ORAL | Status: DC
Start: 2019-07-29 — End: 2019-07-28

## 2019-07-28 MED ORDER — FERROUS SULFATE 325 (65 FE) MG PO TABS
325.00 | ORAL_TABLET | ORAL | Status: DC
Start: 2019-07-29 — End: 2019-07-28

## 2019-07-28 MED ORDER — VARICELLA VIRUS VACCINE LIVE 1350 PFU/0.5ML ~~LOC~~ INJ
0.50 | INJECTION | SUBCUTANEOUS | Status: DC
Start: ? — End: 2019-07-28

## 2019-07-28 MED ORDER — BUPROPION HCL ER (XL) 150 MG PO TB24
150.00 | ORAL_TABLET | ORAL | Status: DC
Start: 2019-07-29 — End: 2019-07-28

## 2019-07-28 MED ORDER — ALUM & MAG HYDROXIDE-SIMETH 200-200-20 MG/5ML PO SUSP
30.00 | ORAL | Status: DC
Start: ? — End: 2019-07-28

## 2019-07-28 MED ORDER — MAGNESIUM HYDROXIDE 400 MG/5ML PO SUSP
30.00 | ORAL | Status: DC
Start: ? — End: 2019-07-28

## 2019-07-28 MED ORDER — GENERIC EXTERNAL MEDICATION
Status: DC
Start: ? — End: 2019-07-28

## 2019-07-28 MED ORDER — DSS 100 MG PO CAPS
100.00 | ORAL_CAPSULE | ORAL | Status: DC
Start: ? — End: 2019-07-28

## 2019-07-28 MED ORDER — SIMETHICONE 80 MG PO CHEW
80.00 | CHEWABLE_TABLET | ORAL | Status: DC
Start: ? — End: 2019-07-28

## 2019-07-28 MED ORDER — BENZOCAINE-MENTHOL 20-0.5 % EX AERO
INHALATION_SPRAY | CUTANEOUS | Status: DC
Start: ? — End: 2019-07-28

## 2019-07-28 MED ORDER — BENZOCAINE-MENTHOL 6-10 MG MT LOZG
1.00 | LOZENGE | OROMUCOSAL | Status: DC
Start: ? — End: 2019-07-28

## 2019-07-28 MED ORDER — ACETAMINOPHEN 500 MG PO TABS
1000.00 | ORAL_TABLET | ORAL | Status: DC
Start: 2019-07-29 — End: 2019-07-28

## 2019-07-28 MED ORDER — ENEMA 7-19 GM/118ML RE ENEM
1.00 | ENEMA | RECTAL | Status: DC
Start: ? — End: 2019-07-28

## 2019-07-28 MED ORDER — GUAIFENESIN 100 MG/5ML PO SYRP
200.00 | ORAL_SOLUTION | ORAL | Status: DC
Start: ? — End: 2019-07-28

## 2019-07-28 MED ORDER — TETANUS-DIPHTH-ACELL PERTUSSIS 5-2.5-18.5 LF-MCG/0.5 IM SUSP
0.50 | INTRAMUSCULAR | Status: DC
Start: ? — End: 2019-07-28

## 2019-07-28 MED ORDER — MEASLES, MUMPS & RUBELLA VAC IJ SOLR
0.50 | INTRAMUSCULAR | Status: DC
Start: ? — End: 2019-07-28

## 2019-07-28 MED ORDER — OXYCODONE HCL 5 MG PO TABS
10.00 | ORAL_TABLET | ORAL | Status: DC
Start: ? — End: 2019-07-28

## 2019-07-28 MED ORDER — LACTATED RINGERS IV SOLN
INTRAVENOUS | Status: DC
Start: ? — End: 2019-07-28

## 2019-07-28 MED ORDER — OXYCODONE HCL 5 MG PO TABS
5.00 | ORAL_TABLET | ORAL | Status: DC
Start: ? — End: 2019-07-28

## 2019-07-28 MED ORDER — PRENATAL 28-0.8 MG PO TABS
1.00 | ORAL_TABLET | ORAL | Status: DC
Start: 2019-07-29 — End: 2019-07-28

## 2019-07-28 MED ORDER — WITCH HAZEL-GLYCERIN EX PADS
1.00 | MEDICATED_PAD | CUTANEOUS | Status: DC
Start: ? — End: 2019-07-28

## 2019-07-28 MED ORDER — NALOXONE HCL 0.4 MG/ML IJ SOLN
0.10 | INTRAMUSCULAR | Status: DC
Start: ? — End: 2019-07-28

## 2019-07-31 MED ORDER — PANTOPRAZOLE SODIUM 40 MG PO TBEC
40.00 | DELAYED_RELEASE_TABLET | ORAL | Status: DC
Start: 2019-08-01 — End: 2019-07-31

## 2019-07-31 MED ORDER — BUPROPION HCL ER (XL) 150 MG PO TB24
150.00 | ORAL_TABLET | ORAL | Status: DC
Start: 2019-08-01 — End: 2019-07-31

## 2019-07-31 MED ORDER — FERROUS SULFATE 325 (65 FE) MG PO TABS
325.00 | ORAL_TABLET | ORAL | Status: DC
Start: 2019-07-31 — End: 2019-07-31

## 2019-07-31 MED ORDER — ACETAMINOPHEN 325 MG PO TABS
650.00 | ORAL_TABLET | ORAL | Status: DC
Start: ? — End: 2019-07-31

## 2019-07-31 MED ORDER — DSS 100 MG PO CAPS
100.00 | ORAL_CAPSULE | ORAL | Status: DC
Start: ? — End: 2019-07-31

## 2019-07-31 MED ORDER — OXYCODONE HCL 5 MG PO TABS
5.00 | ORAL_TABLET | ORAL | Status: DC
Start: ? — End: 2019-07-31
